# Patient Record
Sex: Male | Born: 1935 | Race: White | Hispanic: No | Smoking: Current every day smoker
Health system: Southern US, Community
[De-identification: ages and names within clinical notes are randomized; demographics above are authoritative.]

## PROBLEM LIST (undated history)

## (undated) DIAGNOSIS — E119 Type 2 diabetes mellitus without complications: Secondary | ICD-10-CM

## (undated) DIAGNOSIS — G629 Polyneuropathy, unspecified: Secondary | ICD-10-CM

## (undated) DIAGNOSIS — N179 Acute kidney failure, unspecified: Secondary | ICD-10-CM

## (undated) DIAGNOSIS — I10 Essential (primary) hypertension: Secondary | ICD-10-CM

## (undated) HISTORY — PX: NO PAST SURGERIES: SHX2092

---

## 2013-03-19 DIAGNOSIS — E119 Type 2 diabetes mellitus without complications: Secondary | ICD-10-CM | POA: Diagnosis not present

## 2013-06-20 DIAGNOSIS — M109 Gout, unspecified: Secondary | ICD-10-CM | POA: Diagnosis not present

## 2013-06-20 DIAGNOSIS — E119 Type 2 diabetes mellitus without complications: Secondary | ICD-10-CM | POA: Diagnosis not present

## 2013-06-20 DIAGNOSIS — L0291 Cutaneous abscess, unspecified: Secondary | ICD-10-CM | POA: Diagnosis not present

## 2013-06-20 DIAGNOSIS — I10 Essential (primary) hypertension: Secondary | ICD-10-CM | POA: Diagnosis not present

## 2013-06-20 DIAGNOSIS — L0390 Cellulitis, unspecified: Secondary | ICD-10-CM | POA: Diagnosis not present

## 2013-06-20 DIAGNOSIS — G619 Inflammatory polyneuropathy, unspecified: Secondary | ICD-10-CM | POA: Diagnosis not present

## 2013-12-19 DIAGNOSIS — E119 Type 2 diabetes mellitus without complications: Secondary | ICD-10-CM | POA: Diagnosis not present

## 2013-12-19 DIAGNOSIS — I10 Essential (primary) hypertension: Secondary | ICD-10-CM | POA: Diagnosis not present

## 2013-12-26 DIAGNOSIS — I10 Essential (primary) hypertension: Secondary | ICD-10-CM | POA: Diagnosis not present

## 2013-12-26 DIAGNOSIS — G6289 Other specified polyneuropathies: Secondary | ICD-10-CM | POA: Diagnosis not present

## 2013-12-26 DIAGNOSIS — E119 Type 2 diabetes mellitus without complications: Secondary | ICD-10-CM | POA: Diagnosis not present

## 2013-12-26 DIAGNOSIS — Z716 Tobacco abuse counseling: Secondary | ICD-10-CM | POA: Diagnosis not present

## 2013-12-26 DIAGNOSIS — M1000 Idiopathic gout, unspecified site: Secondary | ICD-10-CM | POA: Diagnosis not present

## 2013-12-26 DIAGNOSIS — Z1389 Encounter for screening for other disorder: Secondary | ICD-10-CM | POA: Diagnosis not present

## 2013-12-26 DIAGNOSIS — N183 Chronic kidney disease, stage 3 (moderate): Secondary | ICD-10-CM | POA: Diagnosis not present

## 2014-06-20 DIAGNOSIS — E119 Type 2 diabetes mellitus without complications: Secondary | ICD-10-CM | POA: Diagnosis not present

## 2014-06-20 DIAGNOSIS — I10 Essential (primary) hypertension: Secondary | ICD-10-CM | POA: Diagnosis not present

## 2014-06-20 DIAGNOSIS — N183 Chronic kidney disease, stage 3 (moderate): Secondary | ICD-10-CM | POA: Diagnosis not present

## 2014-06-27 DIAGNOSIS — N183 Chronic kidney disease, stage 3 (moderate): Secondary | ICD-10-CM | POA: Diagnosis not present

## 2014-06-27 DIAGNOSIS — Z716 Tobacco abuse counseling: Secondary | ICD-10-CM | POA: Diagnosis not present

## 2014-06-27 DIAGNOSIS — M1000 Idiopathic gout, unspecified site: Secondary | ICD-10-CM | POA: Diagnosis not present

## 2014-06-27 DIAGNOSIS — G2581 Restless legs syndrome: Secondary | ICD-10-CM | POA: Diagnosis not present

## 2014-06-27 DIAGNOSIS — I10 Essential (primary) hypertension: Secondary | ICD-10-CM | POA: Diagnosis not present

## 2014-06-27 DIAGNOSIS — E119 Type 2 diabetes mellitus without complications: Secondary | ICD-10-CM | POA: Diagnosis not present

## 2014-06-27 DIAGNOSIS — M1389 Other specified arthritis, multiple sites: Secondary | ICD-10-CM | POA: Diagnosis not present

## 2014-06-27 DIAGNOSIS — G4700 Insomnia, unspecified: Secondary | ICD-10-CM | POA: Diagnosis not present

## 2014-06-27 DIAGNOSIS — G6289 Other specified polyneuropathies: Secondary | ICD-10-CM | POA: Diagnosis not present

## 2014-11-19 DIAGNOSIS — L97509 Non-pressure chronic ulcer of other part of unspecified foot with unspecified severity: Secondary | ICD-10-CM | POA: Diagnosis not present

## 2014-11-20 DIAGNOSIS — L97522 Non-pressure chronic ulcer of other part of left foot with fat layer exposed: Secondary | ICD-10-CM | POA: Diagnosis not present

## 2014-11-20 DIAGNOSIS — L97512 Non-pressure chronic ulcer of other part of right foot with fat layer exposed: Secondary | ICD-10-CM | POA: Diagnosis not present

## 2014-11-25 DIAGNOSIS — I10 Essential (primary) hypertension: Secondary | ICD-10-CM | POA: Diagnosis not present

## 2014-11-25 DIAGNOSIS — F17200 Nicotine dependence, unspecified, uncomplicated: Secondary | ICD-10-CM | POA: Diagnosis not present

## 2014-11-25 DIAGNOSIS — I739 Peripheral vascular disease, unspecified: Secondary | ICD-10-CM | POA: Diagnosis not present

## 2014-12-04 DIAGNOSIS — Z79899 Other long term (current) drug therapy: Secondary | ICD-10-CM | POA: Diagnosis not present

## 2014-12-04 DIAGNOSIS — L97512 Non-pressure chronic ulcer of other part of right foot with fat layer exposed: Secondary | ICD-10-CM | POA: Diagnosis not present

## 2014-12-04 DIAGNOSIS — L97329 Non-pressure chronic ulcer of left ankle with unspecified severity: Secondary | ICD-10-CM | POA: Diagnosis not present

## 2014-12-04 DIAGNOSIS — E11622 Type 2 diabetes mellitus with other skin ulcer: Secondary | ICD-10-CM | POA: Diagnosis not present

## 2014-12-04 DIAGNOSIS — L97519 Non-pressure chronic ulcer of other part of right foot with unspecified severity: Secondary | ICD-10-CM | POA: Diagnosis not present

## 2014-12-04 DIAGNOSIS — I10 Essential (primary) hypertension: Secondary | ICD-10-CM | POA: Diagnosis not present

## 2014-12-04 DIAGNOSIS — L97322 Non-pressure chronic ulcer of left ankle with fat layer exposed: Secondary | ICD-10-CM | POA: Diagnosis not present

## 2014-12-04 DIAGNOSIS — E11621 Type 2 diabetes mellitus with foot ulcer: Secondary | ICD-10-CM | POA: Diagnosis not present

## 2014-12-04 DIAGNOSIS — F17210 Nicotine dependence, cigarettes, uncomplicated: Secondary | ICD-10-CM | POA: Diagnosis not present

## 2014-12-12 DIAGNOSIS — I87313 Chronic venous hypertension (idiopathic) with ulcer of bilateral lower extremity: Secondary | ICD-10-CM | POA: Diagnosis not present

## 2014-12-12 DIAGNOSIS — L97319 Non-pressure chronic ulcer of right ankle with unspecified severity: Secondary | ICD-10-CM | POA: Diagnosis not present

## 2014-12-12 DIAGNOSIS — L97519 Non-pressure chronic ulcer of other part of right foot with unspecified severity: Secondary | ICD-10-CM | POA: Diagnosis not present

## 2014-12-12 DIAGNOSIS — L97322 Non-pressure chronic ulcer of left ankle with fat layer exposed: Secondary | ICD-10-CM | POA: Diagnosis not present

## 2014-12-12 DIAGNOSIS — L97512 Non-pressure chronic ulcer of other part of right foot with fat layer exposed: Secondary | ICD-10-CM | POA: Diagnosis not present

## 2014-12-12 DIAGNOSIS — L97329 Non-pressure chronic ulcer of left ankle with unspecified severity: Secondary | ICD-10-CM | POA: Diagnosis not present

## 2014-12-12 DIAGNOSIS — I83023 Varicose veins of left lower extremity with ulcer of ankle: Secondary | ICD-10-CM | POA: Diagnosis not present

## 2014-12-12 DIAGNOSIS — I872 Venous insufficiency (chronic) (peripheral): Secondary | ICD-10-CM | POA: Diagnosis not present

## 2014-12-18 DIAGNOSIS — E119 Type 2 diabetes mellitus without complications: Secondary | ICD-10-CM | POA: Diagnosis not present

## 2014-12-18 DIAGNOSIS — M1389 Other specified arthritis, multiple sites: Secondary | ICD-10-CM | POA: Diagnosis not present

## 2014-12-18 DIAGNOSIS — N183 Chronic kidney disease, stage 3 (moderate): Secondary | ICD-10-CM | POA: Diagnosis not present

## 2014-12-18 DIAGNOSIS — I10 Essential (primary) hypertension: Secondary | ICD-10-CM | POA: Diagnosis not present

## 2014-12-25 DIAGNOSIS — G4700 Insomnia, unspecified: Secondary | ICD-10-CM | POA: Diagnosis not present

## 2014-12-25 DIAGNOSIS — M1389 Other specified arthritis, multiple sites: Secondary | ICD-10-CM | POA: Diagnosis not present

## 2014-12-25 DIAGNOSIS — G6289 Other specified polyneuropathies: Secondary | ICD-10-CM | POA: Diagnosis not present

## 2014-12-25 DIAGNOSIS — Z716 Tobacco abuse counseling: Secondary | ICD-10-CM | POA: Diagnosis not present

## 2014-12-25 DIAGNOSIS — G2581 Restless legs syndrome: Secondary | ICD-10-CM | POA: Diagnosis not present

## 2014-12-25 DIAGNOSIS — E119 Type 2 diabetes mellitus without complications: Secondary | ICD-10-CM | POA: Diagnosis not present

## 2014-12-25 DIAGNOSIS — N183 Chronic kidney disease, stage 3 (moderate): Secondary | ICD-10-CM | POA: Diagnosis not present

## 2014-12-25 DIAGNOSIS — I10 Essential (primary) hypertension: Secondary | ICD-10-CM | POA: Diagnosis not present

## 2014-12-25 DIAGNOSIS — M1000 Idiopathic gout, unspecified site: Secondary | ICD-10-CM | POA: Diagnosis not present

## 2015-06-03 ENCOUNTER — Encounter (HOSPITAL_COMMUNITY): Payer: Self-pay | Admitting: Emergency Medicine

## 2015-06-03 ENCOUNTER — Inpatient Hospital Stay (HOSPITAL_COMMUNITY): Payer: Medicare Other

## 2015-06-03 ENCOUNTER — Emergency Department (HOSPITAL_COMMUNITY): Payer: Medicare Other

## 2015-06-03 ENCOUNTER — Inpatient Hospital Stay (HOSPITAL_COMMUNITY)
Admission: EM | Admit: 2015-06-03 | Discharge: 2015-06-05 | DRG: 310 | Disposition: A | Discharge status: Home / Self Care | Payer: Medicare Other | Attending: Internal Medicine | Admitting: Internal Medicine

## 2015-06-03 DIAGNOSIS — R55 Syncope and collapse: Secondary | ICD-10-CM | POA: Diagnosis present

## 2015-06-03 DIAGNOSIS — G4700 Insomnia, unspecified: Secondary | ICD-10-CM | POA: Diagnosis present

## 2015-06-03 DIAGNOSIS — J90 Pleural effusion, not elsewhere classified: Secondary | ICD-10-CM | POA: Diagnosis not present

## 2015-06-03 DIAGNOSIS — Z7982 Long term (current) use of aspirin: Secondary | ICD-10-CM | POA: Diagnosis not present

## 2015-06-03 DIAGNOSIS — D649 Anemia, unspecified: Secondary | ICD-10-CM | POA: Diagnosis present

## 2015-06-03 DIAGNOSIS — E119 Type 2 diabetes mellitus without complications: Secondary | ICD-10-CM

## 2015-06-03 DIAGNOSIS — I517 Cardiomegaly: Secondary | ICD-10-CM | POA: Diagnosis not present

## 2015-06-03 DIAGNOSIS — E1142 Type 2 diabetes mellitus with diabetic polyneuropathy: Secondary | ICD-10-CM | POA: Diagnosis present

## 2015-06-03 DIAGNOSIS — I4892 Unspecified atrial flutter: Secondary | ICD-10-CM

## 2015-06-03 DIAGNOSIS — I4891 Unspecified atrial fibrillation: Principal | ICD-10-CM | POA: Diagnosis present

## 2015-06-03 DIAGNOSIS — F17210 Nicotine dependence, cigarettes, uncomplicated: Secondary | ICD-10-CM | POA: Diagnosis present

## 2015-06-03 DIAGNOSIS — I484 Atypical atrial flutter: Secondary | ICD-10-CM | POA: Diagnosis present

## 2015-06-03 DIAGNOSIS — N289 Disorder of kidney and ureter, unspecified: Secondary | ICD-10-CM

## 2015-06-03 DIAGNOSIS — E1122 Type 2 diabetes mellitus with diabetic chronic kidney disease: Secondary | ICD-10-CM | POA: Diagnosis present

## 2015-06-03 DIAGNOSIS — I129 Hypertensive chronic kidney disease with stage 1 through stage 4 chronic kidney disease, or unspecified chronic kidney disease: Secondary | ICD-10-CM | POA: Diagnosis present

## 2015-06-03 DIAGNOSIS — R42 Dizziness and giddiness: Secondary | ICD-10-CM | POA: Diagnosis not present

## 2015-06-03 DIAGNOSIS — I10 Essential (primary) hypertension: Secondary | ICD-10-CM | POA: Diagnosis present

## 2015-06-03 DIAGNOSIS — R001 Bradycardia, unspecified: Secondary | ICD-10-CM | POA: Diagnosis not present

## 2015-06-03 DIAGNOSIS — N179 Acute kidney failure, unspecified: Secondary | ICD-10-CM

## 2015-06-03 DIAGNOSIS — Z7984 Long term (current) use of oral hypoglycemic drugs: Secondary | ICD-10-CM

## 2015-06-03 DIAGNOSIS — N189 Chronic kidney disease, unspecified: Secondary | ICD-10-CM | POA: Diagnosis present

## 2015-06-03 DIAGNOSIS — I4510 Unspecified right bundle-branch block: Secondary | ICD-10-CM | POA: Diagnosis present

## 2015-06-03 HISTORY — DX: Polyneuropathy, unspecified: G62.9

## 2015-06-03 HISTORY — DX: Type 2 diabetes mellitus without complications: E11.9

## 2015-06-03 LAB — CBC WITH DIFFERENTIAL/PLATELET
Basophils Absolute: 0 10*3/uL (ref 0.0–0.1)
Basophils Relative: 0 %
Eosinophils Absolute: 0.2 10*3/uL (ref 0.0–0.7)
Eosinophils Relative: 3 %
HEMATOCRIT: 31 % — AB (ref 39.0–52.0)
HEMOGLOBIN: 9.7 g/dL — AB (ref 13.0–17.0)
LYMPHS ABS: 1.3 10*3/uL (ref 0.7–4.0)
LYMPHS PCT: 16 %
MCH: 28.4 pg (ref 26.0–34.0)
MCHC: 31.3 g/dL (ref 30.0–36.0)
MCV: 90.6 fL (ref 78.0–100.0)
Monocytes Absolute: 0.5 10*3/uL (ref 0.1–1.0)
Monocytes Relative: 7 %
NEUTROS ABS: 6.1 10*3/uL (ref 1.7–7.7)
NEUTROS PCT: 74 %
Platelets: 221 10*3/uL (ref 150–400)
RBC: 3.42 MIL/uL — AB (ref 4.22–5.81)
RDW: 15.6 % — ABNORMAL HIGH (ref 11.5–15.5)
WBC: 8.2 10*3/uL (ref 4.0–10.5)

## 2015-06-03 LAB — COMPREHENSIVE METABOLIC PANEL
ALK PHOS: 97 U/L (ref 38–126)
ALT: 19 U/L (ref 17–63)
AST: 24 U/L (ref 15–41)
Albumin: 3.1 g/dL — ABNORMAL LOW (ref 3.5–5.0)
Anion gap: 10 (ref 5–15)
BUN: 42 mg/dL — ABNORMAL HIGH (ref 6–20)
CALCIUM: 8.9 mg/dL (ref 8.9–10.3)
CO2: 21 mmol/L — AB (ref 22–32)
CREATININE: 3.2 mg/dL — AB (ref 0.61–1.24)
Chloride: 104 mmol/L (ref 101–111)
GFR, EST AFRICAN AMERICAN: 20 mL/min — AB (ref >60)
GFR, EST NON AFRICAN AMERICAN: 17 mL/min — AB (ref >60)
Glucose, Bld: 175 mg/dL — ABNORMAL HIGH (ref 65–99)
Potassium: 4.9 mmol/L (ref 3.5–5.1)
SODIUM: 135 mmol/L (ref 135–145)
Total Bilirubin: 0.7 mg/dL (ref 0.3–1.2)
Total Protein: 7.3 g/dL (ref 6.5–8.1)

## 2015-06-03 LAB — FERRITIN: Ferritin: 40 ng/mL (ref 24–336)

## 2015-06-03 LAB — BRAIN NATRIURETIC PEPTIDE: B Natriuretic Peptide: 141 pg/mL — ABNORMAL HIGH (ref 0.0–100.0)

## 2015-06-03 LAB — IRON AND TIBC
IRON: 17 ug/dL — AB (ref 45–182)
SATURATION RATIOS: 6 % — AB (ref 17.9–39.5)
TIBC: 287 ug/dL (ref 250–450)
UIBC: 270 ug/dL

## 2015-06-03 LAB — MAGNESIUM: MAGNESIUM: 2.1 mg/dL (ref 1.7–2.4)

## 2015-06-03 LAB — TSH: TSH: 3.282 u[IU]/mL (ref 0.350–4.500)

## 2015-06-03 LAB — VITAMIN B12: Vitamin B-12: 1163 pg/mL — ABNORMAL HIGH (ref 180–914)

## 2015-06-03 LAB — D-DIMER, QUANTITATIVE (NOT AT ARMC): D DIMER QUANT: 2.49 ug{FEU}/mL — AB (ref 0.00–0.50)

## 2015-06-03 LAB — PHOSPHORUS: Phosphorus: 4 mg/dL (ref 2.5–4.6)

## 2015-06-03 LAB — TROPONIN I: Troponin I: <0.03 ng/mL (ref <0.031)

## 2015-06-03 MED ORDER — ONDANSETRON HCL 4 MG PO TABS: 4.0000 mg | ORAL_TABLET | Freq: Four times a day (QID) | ORAL | Status: DC | PRN

## 2015-06-03 MED ORDER — ACETAMINOPHEN 650 MG RE SUPP: 650.0000 mg | Freq: Four times a day (QID) | RECTAL | Status: DC | PRN

## 2015-06-03 MED ORDER — HYDROCODONE-ACETAMINOPHEN 5-325 MG PO TABS: 1.0000 | ORAL_TABLET | ORAL | Status: DC | PRN

## 2015-06-03 MED ORDER — ACETAMINOPHEN 325 MG PO TABS: 650.0000 mg | ORAL_TABLET | Freq: Four times a day (QID) | ORAL | Status: DC | PRN

## 2015-06-03 MED ORDER — ADULT MULTIVITAMIN W/MINERALS CH
1.0000 | ORAL_TABLET | Freq: Every day | ORAL | Status: DC
Start: 2015-06-04 — End: 2015-06-05
  Administered 2015-06-04 – 2015-06-05 (×2): 1 via ORAL
  Filled 2015-06-03 (×2): qty 1

## 2015-06-03 MED ORDER — BISACODYL 5 MG PO TBEC: 5.0000 mg | DELAYED_RELEASE_TABLET | Freq: Every day | ORAL | Status: DC | PRN

## 2015-06-03 MED ORDER — SODIUM CHLORIDE 0.9 % IV SOLN: INTRAVENOUS | Status: DC

## 2015-06-03 MED ORDER — PRAMIPEXOLE DIHYDROCHLORIDE 1 MG PO TABS
1.0000 mg | ORAL_TABLET | Freq: Every day | ORAL | Status: DC
  Administered 2015-06-03 – 2015-06-04 (×2): 1 mg via ORAL
  Filled 2015-06-03 (×3): qty 1

## 2015-06-03 MED ORDER — ONDANSETRON HCL 4 MG/2ML IJ SOLN: 4.0000 mg | Freq: Four times a day (QID) | INTRAMUSCULAR | Status: DC | PRN

## 2015-06-03 MED ORDER — TECHNETIUM TC 99M DIETHYLENETRIAME-PENTAACETIC ACID
30.0000 | Freq: Once | INTRAVENOUS | Status: AC | PRN
  Administered 2015-06-03: 30 via RESPIRATORY_TRACT

## 2015-06-03 MED ORDER — OMEGA-3-ACID ETHYL ESTERS 1 G PO CAPS
1.0000 g | ORAL_CAPSULE | Freq: Every day | ORAL | Status: DC
  Administered 2015-06-04 – 2015-06-05 (×2): 1 g via ORAL
  Filled 2015-06-03 (×3): qty 1

## 2015-06-03 MED ORDER — SODIUM CHLORIDE 0.9% FLUSH
3.0000 mL | Freq: Two times a day (BID) | INTRAVENOUS | Status: DC
  Administered 2015-06-03 – 2015-06-04 (×3): 3 mL via INTRAVENOUS

## 2015-06-03 MED ORDER — TECHNETIUM TO 99M ALBUMIN AGGREGATED
4.0000 | Freq: Once | INTRAVENOUS | Status: AC | PRN
  Administered 2015-06-03: 4 via INTRAVENOUS

## 2015-06-03 MED ORDER — TRAZODONE HCL 50 MG PO TABS
150.0000 mg | ORAL_TABLET | Freq: Every day | ORAL | Status: DC
  Administered 2015-06-03 – 2015-06-04 (×2): 150 mg via ORAL
  Filled 2015-06-03 (×2): qty 3

## 2015-06-03 MED ORDER — POLYETHYLENE GLYCOL 3350 17 G PO PACK: 17.0000 g | PACK | Freq: Every day | ORAL | Status: DC | PRN

## 2015-06-03 MED ORDER — INSULIN ASPART 100 UNIT/ML ~~LOC~~ SOLN
0.0000 [IU] | Freq: Three times a day (TID) | SUBCUTANEOUS | Status: DC
  Administered 2015-06-04: 2 [IU] via SUBCUTANEOUS
  Administered 2015-06-04 – 2015-06-05 (×2): 3 [IU] via SUBCUTANEOUS

## 2015-06-03 MED ORDER — HEPARIN SODIUM (PORCINE) 5000 UNIT/ML IJ SOLN
5000.0000 [IU] | Freq: Three times a day (TID) | INTRAMUSCULAR | Status: DC
  Filled 2015-06-03: qty 1

## 2015-06-03 MED ORDER — ASPIRIN EC 81 MG PO TBEC
81.0000 mg | DELAYED_RELEASE_TABLET | Freq: Every day | ORAL | Status: DC
  Administered 2015-06-04: 81 mg via ORAL
  Filled 2015-06-03: qty 1

## 2015-06-03 MED ORDER — GABAPENTIN 300 MG PO CAPS
300.0000 mg | ORAL_CAPSULE | Freq: Two times a day (BID) | ORAL | Status: DC
Start: 2015-06-03 — End: 2015-06-05
  Administered 2015-06-03 – 2015-06-05 (×4): 300 mg via ORAL
  Filled 2015-06-03 (×4): qty 1

## 2015-06-03 MED ORDER — VITAMIN D 1000 UNITS PO TABS
1000.0000 [IU] | ORAL_TABLET | Freq: Every day | ORAL | Status: DC
  Administered 2015-06-04 – 2015-06-05 (×2): 1000 [IU] via ORAL
  Filled 2015-06-03 (×3): qty 1

## 2015-06-03 NOTE — ED Provider Notes (Signed)
CSN: [id number]     Arrival date & time 06/03/15  1352 History   First MD Initiated Contact with Patient 06/03/15 1428     Chief Complaint  Patient presents with  . Loss of Consciousness     (Consider location/radiation/quality/duration/timing/severity/associated sxs/prior Treatment) HPI 81 year old male who presents with loss of consciousness. He has a history of type 2 diabetes. Patient lives at home by himself and states that 4 nights ago he was watching the race on TV when he thinks he may have passed out. I he states that the next thing he noticed, he had awoken 3 hours later and has not known what had happened. He had called his daughter, who subsequently called his primary care doctor and recommended that he come to the ED for evaluation. He denies any bowel incontinence, urinary incontinence, or tongue biting. States it does not happen to him before. Denies any preceding headache, vision or speech changes, focal numbness or weakness, chest pain, difficulty breathing, palpitations, or diaphoresis. States that he otherwise has been in his usual state of health. Has had mild nonproductive cough over the past month without having fevers, chills, worsening lower extremity edema, orthopnea or PND. Past Medical History  Diagnosis Date  . Neuropathy ([place])   . Diabetes mellitus without complication ([place])    History reviewed. No pertinent past surgical history. No family history on file. Social History  Substance Use Topics  . Smoking status: Current Every Day Smoker -- 1.00 packs/day    Types: Cigarettes  . Smokeless tobacco: None  . Alcohol Use: Yes     Comment: occ    Review of Systems 10/14 systems reviewed and are negative other than those stated in the HPI    Allergies  Review of patient's allergies indicates no known allergies.  Home Medications   Prior to Admission medications   Medication Sig Start Date End Date Taking? Authorizing Provider  aspirin 81 MG tablet Take 81  mg by mouth daily.   Yes Historical Provider, MD  cholecalciferol (VITAMIN D) 1000 units tablet Take 1,000 Units by mouth daily.   Yes Historical Provider, MD  gabapentin (NEURONTIN) 300 MG capsule Take 300 mg by mouth 2 (two) times daily.   Yes Historical Provider, MD  glyBURIDE (DIABETA) 5 MG tablet Take 5 mg by mouth 2 (two) times daily with a meal.   Yes Historical Provider, MD  lisinopril (PRINIVIL,ZESTRIL) 40 MG tablet Take 40 mg by mouth daily.   Yes Historical Provider, MD  Multiple Vitamins-Minerals (MULTIVITAMIN WITH MINERALS) tablet Take 1 tablet by mouth daily.   Yes Historical Provider, MD  Omega-3 Fatty Acids (FISH OIL) 1000 MG CAPS Take 1 capsule by mouth 2 (two) times daily.   Yes Historical Provider, MD  pramipexole (MIRAPEX) 1 MG tablet Take 1 mg by mouth at bedtime.   Yes Historical Provider, MD  traZODone (DESYREL) 150 MG tablet Take by mouth at bedtime.   Yes Historical Provider, MD   BP 97/68 mmHg  Pulse 90  Temp(Src) 98.7 F (37.1 C) (Oral)  Resp 20  Ht 6\' 2"  (1.88 m)  Wt 203 lb (92.08 kg)  BMI 26.05 kg/m2  SpO2 94% Physical Exam Physical Exam  Nursing note and vitals reviewed. Constitutional: Well developed, well nourished, non-toxic, and in no acute distress Head: Normocephalic and atraumatic.  Mouth/Throat: Oropharynx is clear and moist.  Neck: Normal range of motion. Neck supple.  Cardiovascular: Normal rate and regular rhythm.  +1 bilateral edema. Pulmonary/Chest: Effort normal and breath sounds  diminished at bilateral bases. No conversational dypsnea Abdominal: Soft. There is no tenderness. There is no rebound and no guarding.  Musculoskeletal: Normal range of motion.  Neurological: Alert, no facial droop, fluent speech, moves all extremities symmetrically Skin: Skin is warm and dry.  Psychiatric: Cooperative  ED Course  Procedures (including critical care time) Labs Review Labs Reviewed  CBC WITH DIFFERENTIAL/PLATELET - Abnormal; Notable for the  following:    RBC 3.42 (*)    Hemoglobin 9.7 (*)    HCT 31.0 (*)    RDW 15.6 (*)    All other components within normal limits  COMPREHENSIVE METABOLIC PANEL - Abnormal; Notable for the following:    CO2 21 (*)    Glucose, Bld 175 (*)    BUN 42 (*)    Creatinine, Ser 3.20 (*)    Albumin 3.1 (*)    GFR calc non Af Amer 17 (*)    GFR calc Af Amer 20 (*)    All other components within normal limits  BRAIN NATRIURETIC PEPTIDE - Abnormal; Notable for the following:    B Natriuretic Peptide 141.0 (*)    All other components within normal limits  D-DIMER, QUANTITATIVE (NOT AT [facility]) - Abnormal; Notable for the following:    D-Dimer, Quant 2.49 (*)    All other components within normal limits  TROPONIN I  MAGNESIUM  PHOSPHORUS  TSH    Imaging Review Dg Chest 2 View  06/03/2015  CLINICAL DATA:  81 year old current smoker with hypertension and diabetes, presenting with bradycardia. EXAM: CHEST  2 VIEW COMPARISON:  06/27/2014. FINDINGS: Cardiac silhouette mildly to moderately enlarged with significant interval increase in heart size since the prior examination. Thoracic aorta tortuous and atherosclerotic, unchanged. Hilar and mediastinal contours otherwise unremarkable. Emphysematous changes in the upper lobes. Mild pulmonary venous hypertension without overt edema. Small bilateral pleural effusions. Linear atelectasis in the lower lobes and right middle lobe. No confluent airspace consolidation. Degenerative changes involving the thoracic spine. IMPRESSION: 1. Small bilateral pleural effusions. 2. Linear atelectasis in the lower lobes right middle lobe. 3. Mild to moderate cardiomegaly with significant interval increase in heart size since June, 2016. Pulmonary venous hypertension without overt edema. Electronically Signed   By: Evangeline Dakin M.D.   On: 06/03/2015 14:32   Ct Head Wo Contrast  06/03/2015  CLINICAL DATA:  Dizziness and syncope EXAM: CT HEAD WITHOUT CONTRAST TECHNIQUE: Contiguous  axial images were obtained from the base of the skull through the vertex without intravenous contrast. COMPARISON:  None. FINDINGS: There is mild diffuse atrophy. There is no intracranial mass hemorrhage, extra-axial fluid collection, or midline shift. There is slight small vessel disease in the centra semiovale bilaterally. No acute infarct is evident. A small amount of benign calcification is noted in medial right temporal lobe region. The bony calvarium appears intact although somewhat osteoporotic. The mastoid air cells clear. No intraorbital lesions are evident. There are foci of calcification in the cavernous carotid artery regions as well as in both distal vertebral arteries. IMPRESSION: Mild atrophy with mild periventricular small vessel disease. No intracranial mass, hemorrhage, or evidence of acute infarct. Electronically Signed   By: Lowella Grip III M.D.   On: 06/03/2015 16:05   I have personally reviewed and evaluated these images and lab results as part of my medical decision-making.   EKG Interpretation   Date/Time:  Tuesday Jun 03 2015 14:10:08 EDT Ventricular Rate:  96 PR Interval:    QRS Duration: 160 QT Interval:  384 QTC Calculation:  485 R Axis:   117 Text Interpretation:  Atrial flutter Ventricular bigeminy RBBB and LPFB No  prior EKG for comparison Confirmed by Perez Dirico MD, Namari Breton [phone]) on 06/03/2015  2:30:31 PM     This patients CHA2DS2-VASc Score and unadjusted Ischemic Stroke Rate (% per year) is equal to 4.8 % stroke rate/year from a score of 4  Above score calculated as 1 point each if present [CHF, HTN, DM, Vascular=MI/PAD/Aortic Plaque, Age if 65-74, or Male] Above score calculated as 2 points each if present [Age > 75, or Stroke/TIA/TE]   MDM   Final diagnoses:  Syncope  Atypical atrial flutter ([place])  Kidney disease  Cardiomegaly    81 year old male with history of diabetes who presents with questionable syncopal episode. Is asymptomatic on arrival,  but is noted to have new onset atrial flutter with ventricular bigeminy. With cardiomegaly on chest x-ray, bilateral pleural effusions, and minimally elevated BNP of 540. Concern for new onset atrial flutter and potential new onset heart failure, that certainly can contribute to potential syncope. Troponin negative. Anemia of 9.7, but no history of GI bleeding, likely this is chronic. With decreased kidney function as well, 3.3, unclear baseline but likely chornic.   Unclear if syncopal episode was truly 3 hours in length, which would make cardiogenic syncope less likely.  CT head negative for mass or bleeding for potential ? Seizure w/u. Was noted to have some low SpO2 low 90s during ED course and ddimer positive. Will undergo V/Q for evaluation of potential PE. Discussed with Dr. Myna Hidalgo, who will admit for syncopal w/u.     Forde Dandy, MD 06/03/15 [id number]

## 2015-06-03 NOTE — H&P (Signed)
History and Physical    Dylan Williamson [id number] DOB: [birth year] DOA: 06/03/2015  PCP: Rory Percy, MD   Patient coming from: Home   Chief Complaint: Syncopal episode  HPI: Dylan Williamson is a 81 y.o. male with medical history significant for hypertension, type 2 diabetes mellitus with peripheral neuropathy, and insomnia who presents to the ED at the direction of his PCP for evaluation of a syncopal episode that occurred the night of 06/01/2015. Patient reports pain in his usual state of health when he was sitting on his couch preparing to watch a car race on TV when he believes he "blacked out," waking ~3 hours later. Patient denies any preceding symptoms such as chest pain, palpitations, lightheadedness, nausea, or sweats. He reports being in his usual state of health since time of that episode. He denies any recent fevers, chills, long distance travel, or sick contacts. He has had no dyspnea or cough and denies headaches, focal numbness or weakness, or loss of coordination. He has never experienced a similar episode previously. He was hesitant to come to the emergency department, but eventually agreed at the urging of his daughter and PCP.  ED Course: Upon arrival to the ED, patient is found to be afebrile, saturating well on room air, and with vital signs stable. EKG features atrial flutter with right bundle branch block and left anterior fascicular block. Chest x-ray demonstrates small bilateral pleural effusions and mild to moderate cardiomegaly with significant increase since the prior study one year ago. Head CT is negative for acute intracranial abnormality. CMP returns notable for BUN of 42 and serum creatinine of 3.20 with unknown baseline. CBC features a hemoglobin of 9.7 with normal MCV. Troponin is undetectable and BNP is mildly elevated 241. Patient was monitored on telemetry in the emergency department, remained in atrial arrhythmia, TSH was normal, and a d-dimer was obtained  by the EDP. D-dimer turns elevated at 2.49 and VQ scan has been ordered. Patient has remained hemodynamically stable in the emergency department and will be admitted to the hospital for ongoing evaluation and management of syncopal episode with apparent new onset atrial flutter and kidney disease of unknown chronicity.  Review of Systems:  All other systems reviewed and apart from HPI, are negative.  Past Medical History  Diagnosis Date  . Neuropathy ([place])   . Diabetes mellitus without complication ([place])     History reviewed. No pertinent past surgical history.   reports that he has been smoking Cigarettes.  He has been smoking about 1.00 pack per day. He does not have any smokeless tobacco history on file. He reports that he drinks alcohol. He reports that he does not use illicit drugs.  No Known Allergies  History reviewed. No pertinent family history.   Prior to Admission medications   Medication Sig Start Date End Date Taking? Authorizing Provider  aspirin 81 MG tablet Take 81 mg by mouth daily.   Yes Historical Provider, MD  cholecalciferol (VITAMIN D) 1000 units tablet Take 1,000 Units by mouth daily.   Yes Historical Provider, MD  gabapentin (NEURONTIN) 300 MG capsule Take 300 mg by mouth 2 (two) times daily.   Yes Historical Provider, MD  glyBURIDE (DIABETA) 5 MG tablet Take 5 mg by mouth 2 (two) times daily with a meal.   Yes Historical Provider, MD  lisinopril (PRINIVIL,ZESTRIL) 40 MG tablet Take 40 mg by mouth daily.   Yes Historical Provider, MD  Multiple Vitamins-Minerals (MULTIVITAMIN WITH MINERALS) tablet Take 1 tablet by mouth daily.  Yes Historical Provider, MD  Omega-3 Fatty Acids (FISH OIL) 1000 MG CAPS Take 1 capsule by mouth 2 (two) times daily.   Yes Historical Provider, MD  pramipexole (MIRAPEX) 1 MG tablet Take 1 mg by mouth at bedtime.   Yes Historical Provider, MD  traZODone (DESYREL) 150 MG tablet Take by mouth at bedtime.   Yes Historical Provider, MD     Physical Exam: Filed Vitals:   06/03/15 1645 06/03/15 1650 06/03/15 1700 06/03/15 1715  BP:  97/68 109/77   Pulse: 71 90 83 72  Temp:      TempSrc:      Resp: 18 20 21 22   Height:      Weight:      SpO2: 94% 94% 95% 94%      Constitutional: NAD, calm, comfortable Eyes: PERTLA, lids and conjunctivae normal ENMT: Mucous membranes are moist. Posterior pharynx clear of any exudate or lesions.   Neck: normal, supple, no masses, no thyromegaly Respiratory: clear to auscultation bilaterally, no wheezing, no crackles. Normal respiratory effort.    Cardiovascular: S1 & S2 heard, irregular rhythm, 1+ pretibial pitting edema. No significant JVD. Abdomen: No distension, no tenderness, no masses palpated. Bowel sounds normal.  Musculoskeletal: no clubbing / cyanosis. No joint deformity upper and lower extremities. Normal muscle tone.  Skin: Hyperpigmentation to distal LEs b/l with scarring. Warm, dry, well-perfused. Neurologic: CN 2-12 grossly intact. Sensation intact, DTR normal. Strength 5/5 in all 4 limbs.  Psychiatric: Normal judgment and insight. Alert and oriented x 3. Normal mood and affect.     Labs on Admission: I have personally reviewed following labs and imaging studies  CBC:  Recent Labs Lab 06/03/15 1404  WBC 8.2  NEUTROABS 6.1  HGB 9.7*  HCT 31.0*  MCV 90.6  PLT [id number]   Basic Metabolic Panel:  Recent Labs Lab 06/03/15 1404  NA 135  K 4.9  CL 104  CO2 21*  GLUCOSE 175*  BUN 42*  CREATININE 3.20*  CALCIUM 8.9  MG 2.1  PHOS 4.0   GFR: Estimated Creatinine Clearance: 22.1 mL/min (by C-G formula based on Cr of 3.2). Liver Function Tests:  Recent Labs Lab 06/03/15 1404  AST 24  ALT 19  ALKPHOS 97  BILITOT 0.7  PROT 7.3  ALBUMIN 3.1*   No results for input(s): LIPASE, AMYLASE in the last 168 hours. No results for input(s): AMMONIA in the last 168 hours. Coagulation Profile: No results for input(s): INR, PROTIME in the last 168 hours. Cardiac  Enzymes:  Recent Labs Lab 06/03/15 1404  TROPONINI <0.03   BNP (last 3 results) No results for input(s): PROBNP in the last 8760 hours. HbA1C: No results for input(s): HGBA1C in the last 72 hours. CBG: No results for input(s): GLUCAP in the last 168 hours. Lipid Profile: No results for input(s): CHOL, HDL, LDLCALC, TRIG, CHOLHDL, LDLDIRECT in the last 72 hours. Thyroid Function Tests:  Recent Labs  06/03/15 1404  TSH 3.282   Anemia Panel: No results for input(s): VITAMINB12, FOLATE, FERRITIN, TIBC, IRON, RETICCTPCT in the last 72 hours. Urine analysis: No results found for: COLORURINE, APPEARANCEUR, LABSPEC, PHURINE, GLUCOSEU, HGBUR, BILIRUBINUR, KETONESUR, PROTEINUR, UROBILINOGEN, NITRITE, LEUKOCYTESUR Sepsis Labs: @LABRCNTIP (procalcitonin:4,lacticidven:4) )No results found for this or any previous visit (from the past 240 hour(s)).   Radiological Exams on Admission: Dg Chest 2 View  06/03/2015  CLINICAL DATA:  81 year old current smoker with hypertension and diabetes, presenting with bradycardia. EXAM: CHEST  2 VIEW COMPARISON:  06/27/2014. FINDINGS: Cardiac silhouette mildly to moderately  enlarged with significant interval increase in heart size since the prior examination. Thoracic aorta tortuous and atherosclerotic, unchanged. Hilar and mediastinal contours otherwise unremarkable. Emphysematous changes in the upper lobes. Mild pulmonary venous hypertension without overt edema. Small bilateral pleural effusions. Linear atelectasis in the lower lobes and right middle lobe. No confluent airspace consolidation. Degenerative changes involving the thoracic spine. IMPRESSION: 1. Small bilateral pleural effusions. 2. Linear atelectasis in the lower lobes right middle lobe. 3. Mild to moderate cardiomegaly with significant interval increase in heart size since June, 2016. Pulmonary venous hypertension without overt edema. Electronically Signed   By: Evangeline Dakin M.D.   On: 06/03/2015  14:32   Ct Head Wo Contrast  06/03/2015  CLINICAL DATA:  Dizziness and syncope EXAM: CT HEAD WITHOUT CONTRAST TECHNIQUE: Contiguous axial images were obtained from the base of the skull through the vertex without intravenous contrast. COMPARISON:  None. FINDINGS: There is mild diffuse atrophy. There is no intracranial mass hemorrhage, extra-axial fluid collection, or midline shift. There is slight small vessel disease in the centra semiovale bilaterally. No acute infarct is evident. A small amount of benign calcification is noted in medial right temporal lobe region. The bony calvarium appears intact although somewhat osteoporotic. The mastoid air cells clear. No intraorbital lesions are evident. There are foci of calcification in the cavernous carotid artery regions as well as in both distal vertebral arteries. IMPRESSION: Mild atrophy with mild periventricular small vessel disease. No intracranial mass, hemorrhage, or evidence of acute infarct. Electronically Signed   By: Lowella Grip III M.D.   On: 06/03/2015 16:05    EKG: Independently reviewed. Atrial flutter, RBBB, LaFB  Assessment/Plan  1. Syncope  - Occurred while seated; no prodrome; pt reported feeling well prior to the event and after - Etiology is uncertain, but primary concern is for a cardiac etiology given the new arrhythmia  - Monitor on telemetry  - Obtain echocardiogram, ordered - D-dimer elevated in ED, follow-up VQ scan and treat as indicated    2. New-onset atrial flutter  - Presents with atrial flutter, asymptomatic and normal HR  - CHADS-VASc 5 (age x2, HTN, DM, PAD)  - TSH is wnl, troponin <0.03  - Monitor on telemetry  - Check TTE, ordered  - Continue daily ASA and VTE ppx; consider AC   3. Kidney disease of unknown chronicity  - SCr 3.20 on admission with unknown baseline  - Renal US with cortical thinning suggestive of chronic medical renal disease  - Hold lisinopril for now; avoid nephrotoxins   - Gentle  IVF hydration overnight  - Repeat chem panel in am    4. Hypertension  - At goal currently  - Managed with lisinopril at home  - Hold lisinopril for now given severe kidney disease with uncertain baseline   5. Type II DM  - No A1c on file  - Managed with glyburide at home, holding while in hospital  - Check CBG with meals and qHS - Low-intensity SSI, adjust prn  - Check A1c, pending  6. Anemia - Hgb 9.7 on admission with normal MCV  - No prior available for comparison  - No sign of active blood-loss  - Possibly secondary to CKD  - Check iron studies, B12, and folate; supplement prn  - Check FOBT   7. Insomnia  - Managed with trazodone at home, will continue    DVT prophylaxis: sq heparin  Code Status: Full  Family Communication: Daughter updated at bedside  Disposition Plan: Admit to  telemetry  Consults called: None  Admission status: Inpatient    Vianne Bulls, MD [facility] Pager [phone]  If 7PM-7AM, please contact night-coverage [email or web address] Password [email or web address]  06/03/2015, 5:17 PM

## 2015-06-03 NOTE — ED Notes (Signed)
Saturday night he blacked out for 3 hours.  Dr. Nadara Mustard wanted him to come to the ER to have labs, and EKG, and rule out cardiac problems.  He had fixed him something to eat and had spilled it all over him.  He felt light headed and dizzy.  Denies any problems at this time.

## 2015-06-03 NOTE — ED Notes (Signed)
Report given to Horris Latino, RN for room 333.

## 2015-06-04 ENCOUNTER — Inpatient Hospital Stay (HOSPITAL_COMMUNITY): Payer: Medicare Other

## 2015-06-04 DIAGNOSIS — I4891 Unspecified atrial fibrillation: Principal | ICD-10-CM

## 2015-06-04 DIAGNOSIS — R55 Syncope and collapse: Secondary | ICD-10-CM

## 2015-06-04 LAB — BASIC METABOLIC PANEL
Anion gap: 12 (ref 5–15)
BUN: 43 mg/dL — AB (ref 6–20)
CO2: 21 mmol/L — ABNORMAL LOW (ref 22–32)
CREATININE: 2.99 mg/dL — AB (ref 0.61–1.24)
Calcium: 8.6 mg/dL — ABNORMAL LOW (ref 8.9–10.3)
Chloride: 103 mmol/L (ref 101–111)
GFR calc Af Amer: 22 mL/min — ABNORMAL LOW (ref >60)
GFR, EST NON AFRICAN AMERICAN: 19 mL/min — AB (ref >60)
Glucose, Bld: 137 mg/dL — ABNORMAL HIGH (ref 65–99)
Potassium: 4.8 mmol/L (ref 3.5–5.1)
SODIUM: 136 mmol/L (ref 135–145)

## 2015-06-04 LAB — HEMOGLOBIN A1C
Hgb A1c MFr Bld: 7 % — ABNORMAL HIGH (ref 4.8–5.6)
MEAN PLASMA GLUCOSE: 154 mg/dL

## 2015-06-04 LAB — FOLATE RBC
Folate, Hemolysate: >620 ng/mL
Folate, RBC: >2123 ng/mL (ref >498)
Hematocrit: 29.2 % — ABNORMAL LOW (ref 37.5–51.0)

## 2015-06-04 LAB — NA AND K (SODIUM & POTASSIUM), RAND UR
Potassium Urine: 37 mmol/L
SODIUM UR: 87 mmol/L

## 2015-06-04 LAB — GLUCOSE, CAPILLARY
GLUCOSE-CAPILLARY: 103 mg/dL — AB (ref 65–99)
Glucose-Capillary: 130 mg/dL — ABNORMAL HIGH (ref 65–99)
Glucose-Capillary: 165 mg/dL — ABNORMAL HIGH (ref 65–99)
Glucose-Capillary: 81 mg/dL (ref 65–99)

## 2015-06-04 LAB — CREATININE, URINE, RANDOM: Creatinine, Urine: 103.64 mg/dL

## 2015-06-04 MED ORDER — ENOXAPARIN SODIUM 30 MG/0.3ML ~~LOC~~ SOLN: 30.0000 mg | SUBCUTANEOUS | Status: DC

## 2015-06-04 MED ORDER — APIXABAN 5 MG PO TABS: 5.0000 mg | ORAL_TABLET | Freq: Two times a day (BID) | ORAL | Status: DC

## 2015-06-04 MED ORDER — NICOTINE 14 MG/24HR TD PT24
14.0000 mg | MEDICATED_PATCH | Freq: Every day | TRANSDERMAL | Status: DC
  Administered 2015-06-04 – 2015-06-05 (×2): 14 mg via TRANSDERMAL
  Filled 2015-06-04 (×2): qty 1

## 2015-06-04 MED ORDER — ASPIRIN EC 81 MG PO TBEC: 81.0000 mg | DELAYED_RELEASE_TABLET | Freq: Every day | ORAL | Status: DC

## 2015-06-04 NOTE — Progress Notes (Signed)
PROGRESS NOTE    Dylan Williamson  [id number] DOB: [birth year] DOA: 06/03/2015 PCP: Rory Percy, MD     Brief Narrative:  81 year old man admitted on 5/30 following a syncopal event that occurred 2 days prior to admission. Upon arrival he was found to be in atrial fibrillation and this is thought to be potentially the reason for his syncope. Echocardiogram is pending. Given his CHADSVasc score would benefit from starting anticoagulation.   Assessment & Plan:   Principal Problem:   Syncope Active Problems:   Atrial flutter (HCC)   Kidney disease   Normocytic anemia   Hypertension   Non-insulin dependent type 2 diabetes mellitus ([place])   Insomnia   New onset atrial fibrillation -Discussed EKG and telemetry with Dr. Harl Bowie. He believes this likely represents atrial fibrillation. -Cardiology will see patient in consultation. Unclear if this is the etiology for his syncope but so far workup has been otherwise negative. -Would benefit from anticoagulation to decrease stroke risk, cardiology to make final decision.  Diabetes mellitus -Fair control  Hypertension -Well-controlled  Chronic kidney disease -Unknown chronicity, however creatinine 3.2 on admission down to 2.9 today, suspect some degree of chronic kidney dysfunction given renal ultrasound with cortical thinning suggestive of chronic medical renal disease. Hold lisinopril and avoid nephrotoxins, continue gentle IV fluid hydration.    DVT prophylaxis: Lovenox Code Status: full code Family Communication: daughter at bedside updated on plan of care and all questions answered Disposition Plan: Home in 24-48 hours  Consultants:   Cardiology  Procedures:   None  Antimicrobials:   None    Subjective: Feels well. Anxious to be discharged home.  Objective: Filed Vitals:   06/04/15 0554 06/04/15 0709 06/04/15 0940 06/04/15 0945  BP: 94/57  114/65   Pulse: 72  47 87  Temp: 98.4 F (36.9 C)     TempSrc:  Oral     Resp: 18     Height:      Weight:  94.394 kg (208 lb 1.6 oz)    SpO2: 92%  96% 93%    Intake/Output Summary (Last 24 hours) at 06/04/15 1211 Last data filed at 06/04/15 0900  Gross per 24 hour  Intake    240 ml  Output      0 ml  Net    240 ml   Filed Weights   06/03/15 1400 06/04/15 0709  Weight: 92.08 kg (203 lb) 94.394 kg (208 lb 1.6 oz)    Examination:  General exam: Alert, awake, oriented x 3 Respiratory system: Clear to auscultation. Respiratory effort normal. Cardiovascular system:irregular, no M/R/G Gastrointestinal system: Abdomen is nondistended, soft and nontender. No organomegaly or masses felt. Normal bowel sounds heard. Central nervous system: Alert and oriented. No focal neurological deficits. Extremities: No C/C/E, +pedal pulses Skin: No rashes, lesions or ulcers Psychiatry: Judgement and insight appear normal. Mood & affect appropriate.     Data Reviewed: I have personally reviewed following labs and imaging studies  CBC:  Recent Labs Lab 06/03/15 1404  WBC 8.2  NEUTROABS 6.1  HGB 9.7*  HCT 31.0*  MCV 90.6  PLT [id number]   Basic Metabolic Panel:  Recent Labs Lab 06/03/15 1404 06/04/15 0601  NA 135 136  K 4.9 4.8  CL 104 103  CO2 21* 21*  GLUCOSE 175* 137*  BUN 42* 43*  CREATININE 3.20* 2.99*  CALCIUM 8.9 8.6*  MG 2.1  --   PHOS 4.0  --    GFR: Estimated Creatinine Clearance: 23.7 mL/min (by C-G formula based  on Cr of 2.99). Liver Function Tests:  Recent Labs Lab 06/03/15 1404  AST 24  ALT 19  ALKPHOS 97  BILITOT 0.7  PROT 7.3  ALBUMIN 3.1*   No results for input(s): LIPASE, AMYLASE in the last 168 hours. No results for input(s): AMMONIA in the last 168 hours. Coagulation Profile: No results for input(s): INR, PROTIME in the last 168 hours. Cardiac Enzymes:  Recent Labs Lab 06/03/15 1404  TROPONINI <0.03   BNP (last 3 results) No results for input(s): PROBNP in the last 8760 hours. HbA1C:  Recent Labs   06/03/15 1721  HGBA1C 7.0*   CBG:  Recent Labs Lab 06/04/15 0740 06/04/15 1117  GLUCAP 130* 165*   Lipid Profile: No results for input(s): CHOL, HDL, LDLCALC, TRIG, CHOLHDL, LDLDIRECT in the last 72 hours. Thyroid Function Tests:  Recent Labs  06/03/15 1404  TSH 3.282   Anemia Panel:  Recent Labs  06/03/15 1725  VITAMINB12 1163*  FERRITIN 40  TIBC 287  IRON 17*   Urine analysis: No results found for: COLORURINE, APPEARANCEUR, LABSPEC, PHURINE, GLUCOSEU, HGBUR, BILIRUBINUR, KETONESUR, PROTEINUR, UROBILINOGEN, NITRITE, LEUKOCYTESUR Sepsis Labs: @LABRCNTIP (procalcitonin:4,lacticidven:4)  )No results found for this or any previous visit (from the past 240 hour(s)).       Radiology Studies: Dg Chest 2 View  06/03/2015  CLINICAL DATA:  81 year old current smoker with hypertension and diabetes, presenting with bradycardia. EXAM: CHEST  2 VIEW COMPARISON:  06/27/2014. FINDINGS: Cardiac silhouette mildly to moderately enlarged with significant interval increase in heart size since the prior examination. Thoracic aorta tortuous and atherosclerotic, unchanged. Hilar and mediastinal contours otherwise unremarkable. Emphysematous changes in the upper lobes. Mild pulmonary venous hypertension without overt edema. Small bilateral pleural effusions. Linear atelectasis in the lower lobes and right middle lobe. No confluent airspace consolidation. Degenerative changes involving the thoracic spine. IMPRESSION: 1. Small bilateral pleural effusions. 2. Linear atelectasis in the lower lobes right middle lobe. 3. Mild to moderate cardiomegaly with significant interval increase in heart size since June, 2016. Pulmonary venous hypertension without overt edema. Electronically Signed   By: Evangeline Dakin M.D.   On: 06/03/2015 14:32   Ct Head Wo Contrast  06/03/2015  CLINICAL DATA:  Dizziness and syncope EXAM: CT HEAD WITHOUT CONTRAST TECHNIQUE: Contiguous axial images were obtained from the  base of the skull through the vertex without intravenous contrast. COMPARISON:  None. FINDINGS: There is mild diffuse atrophy. There is no intracranial mass hemorrhage, extra-axial fluid collection, or midline shift. There is slight small vessel disease in the centra semiovale bilaterally. No acute infarct is evident. A small amount of benign calcification is noted in medial right temporal lobe region. The bony calvarium appears intact although somewhat osteoporotic. The mastoid air cells clear. No intraorbital lesions are evident. There are foci of calcification in the cavernous carotid artery regions as well as in both distal vertebral arteries. IMPRESSION: Mild atrophy with mild periventricular small vessel disease. No intracranial mass, hemorrhage, or evidence of acute infarct. Electronically Signed   By: Lowella Grip III M.D.   On: 06/03/2015 16:05   US Renal  06/03/2015  CLINICAL DATA:  Acute kidney injury.  Diabetes mellitus. EXAM: RENAL / URINARY TRACT ULTRASOUND COMPLETE COMPARISON:  None. FINDINGS: Right Kidney: Length: 11.7 cm. Diffuse renal parenchymal thinning and increased echogenicity noted. Several small right renal cysts also noted. No mass or hydronephrosis visualized. Left Kidney: Length: 11.6 cm. Diffuse renal parenchymal thinning and increased echogenicity noted. Bilobed cyst or 2 adjacent cysts seen in the midpole  measuring up to 4.3 x 2.3 cm. No mass or hydronephrosis visualized. Bladder: Appears normal for degree of bladder distention. Incidentally noted is borderline splenomegaly with spleen measuring approximately 13 cm in length and bilateral pleural effusions. IMPRESSION: Bilateral renal parenchymal thinning/ atrophy and increased echogenicity, consistent chronic medical renal disease. No evidence of hydronephrosis. Bilateral renal cysts. Incidentally noted bilateral pleural effusions and borderline splenomegaly. Electronically Signed   By: Earle Gell M.D.   On: 06/03/2015 18:19     Nm Pulmonary Perf And Vent  06/03/2015  CLINICAL DATA:  Persistent cough, passed out 2 days ago, bradycardia EXAM: NUCLEAR MEDICINE VENTILATION - PERFUSION LUNG SCAN TECHNIQUE: Ventilation images were obtained in multiple projections using inhaled aerosol Tc-71m DTPA. Perfusion images were obtained in multiple projections after intravenous injection of Tc-45m MAA. RADIOPHARMACEUTICALS:  30 mCi Technetium-48m DTPA aerosol inhalation and 4.4 mCi Technetium-61m MAA IV COMPARISON:  Correlation with chest radiograph dated 06/03/2015 FINDINGS: Ventilation: Heterogeneous ventilation with clumping in the central airways, compatible with COPD. Perfusion: Mildly heterogeneous perfusion. No wedge shaped peripheral perfusion defects to suggest acute pulmonary embolism. Corresponding chest radiograph demonstrates mild bibasilar atelectasis and small bilateral pleural effusions. IMPRESSION: Low probability for pulmonary embolism. Electronically Signed   By: Julian Hy M.D.   On: 06/03/2015 19:39        Scheduled Meds: . aspirin EC  81 mg Oral Daily  . cholecalciferol  1,000 Units Oral Daily  . gabapentin  300 mg Oral BID  . heparin  5,000 Units Subcutaneous Q8H  . insulin aspart  0-15 Units Subcutaneous TID WC  . multivitamin with minerals  1 tablet Oral Daily  . omega-3 acid ethyl esters  1 g Oral Daily  . pramipexole  1 mg Oral QHS  . sodium chloride flush  3 mL Intravenous Q12H  . traZODone  150 mg Oral QHS   Continuous Infusions: . sodium chloride       LOS: 1 day    Time spent: 25 minutes. Greater than 50% of this time was spent in direct contact with the patient coordinating care.     Lelon Frohlich, MD [facility] Pager [phone]  If 7PM-7AM, please contact night-coverage [email or web address] Password [email or web address] 06/04/2015, 12:11 PM

## 2015-06-04 NOTE — Care Management Note (Signed)
Case Management Note  Patient Details  Name: Dylan Williamson MRN: [id number] Date of Birth: May 29, 1934  Subjective/Objective:   Spoke with patient for discharge planning. Alert oriented and daughter present in the room. Patient stated that he lives home alone and still drives himself to appointments. PCP is Dr Nadara Mustard. Thinks he has a walker somewhere in his home but does not use any equipment to ambulate. Has family support of his 7 children . Denies issues filling medication scripts.   PT has recommended outpatient PT will set this up. No other needs assessed.             Action/Plan: Home with self care outpatient PT.   Expected Discharge Date:                  Expected Discharge Plan:     In-House Referral:     Discharge planning Services  CM Consult  Post Acute Care Choice:    Choice offered to:     DME Arranged:    DME Agency:     HH Arranged:    [place] Agency:     Status of Service:  Completed, signed off  Medicare Important Message Given:    Date Medicare IM Given:    Medicare IM give by:    Date Additional Medicare IM Given:    Additional Medicare Important Message give by:     If discussed at [place] of Stay Meetings, dates discussed:    Additional Comments:  Alvie Heidelberg, RN 06/04/2015, 2:43 PM

## 2015-06-04 NOTE — Evaluation (Signed)
Physical Therapy Evaluation Patient Details Name: Dylan Williamson MRN: [id number] DOB: 02-07-1934 Today's Date: 06/04/2015   History of Present Illness  81 yo M admitted after syncopal episode while sitting on the sofa where he reports blacking out for ~3hrs.  EKG notable for A-flutter, R BBB, L anterior fasicular block, and CXR with small B pleural effusions, mild/moderate cardiomegaly, elevated D-dimer but NM pulmonary perf & vent with low probability for PE.  PMH: neuropathy, DM2, HTN.  Clinical Impression  Pt received sitting up in the chair, granddaughter present, and pt was agreeable to PT evaluation.  Pt states that he lives alone, and is normally independent with ADL's, and IADL's.   He is able to drive, and run his errands, but uses a cart to lean on when at the grocery store.  Pt has some chronic issues with R shoulder, likely rotator cuff, that makes it difficult for him to perform AROM of R shoulder, however pt compensates by using L UE to assist the right.  Pt also has some pain and swelling in the R knee that has been going on for 3 months.  Today during PT evaluation, he required supervision/min guard for sit<>stand transfer and ambulation 19ftx2 trial.  Gait distance was limited by the pt due to pt expressing that he felt SOB with SpO2 noted to be 96% on RA with HR at 47bpm.  After 2nd gait trial he also stated he felt SOB, but not as bad, and SpO2 was 93% with HR at 87bpm.  At this point he is recommended for f/u OPPT to progress with pt's endurance and strength.     Follow Up Recommendations Outpatient PT    Equipment Recommendations  None recommended by PT    Recommendations for Other Services       Precautions / Restrictions Restrictions Weight Bearing Restrictions: No      Mobility  Bed Mobility Overal bed mobility:  (Not assessed - pt sitting up in the chair upon arrival. )                Transfers Overall transfer level: Needs assistance   Transfers: Sit  to/from Stand Sit to Stand: Supervision            Ambulation/Gait Ambulation/Gait assistance: Min guard;Supervision Ambulation Distance (Feet): 50 Feet (x2 trials) Assistive device: None Gait Pattern/deviations: Step-through pattern     General Gait Details: Gait distance limited by fatigue with pt expressing that she felt SOB.  SpO2 96% on RA with HR at 47bpm after 1st gait trial, and 93% on RA after 2nd giat trial and HR 87bpm.  Stairs            Wheelchair Mobility    Modified Rankin (Stroke Patients Only)       Balance Overall balance assessment: Needs assistance         Standing balance support: No upper extremity supported Standing balance-Leahy Scale: Good                               Pertinent Vitals/Pain Pain Assessment: No/denies pain    Home Living   Living Arrangements: Alone   Type of Home: House Home Access: Ramped entrance     Home Layout: One level;Laundry or work area in [place]: Kasandra Knudsen - single point;Walker - 2 wheels      Prior Function Level of Independence: Independent         Comments: Pt states  that he is independent with ADL's, and IADL's.  He is still driving and able to run his own errands, but needs to use a cart when at the grocery store.  Pt does not use any DME for ambulation.     Hand Dominance        Extremity/Trunk Assessment   Upper Extremity Assessment: RUE deficits/detail;LUE deficits/detail RUE Deficits / Details: Able to flex shoulder >30* actively, PROM = ~70* but limited by pain.  Elbow strength flexion/extension is 5/5.  Pt states he has to use his L hand to assist the right hand with eating.      LUE Deficits / Details: Able to flex shoulder to ~100* actively, and able to achieve PROM of ~110 before limited by pain.  Elbow strength flexion/extension is 5/5   Lower Extremity Assessment: Generalized weakness;RLE deficits/detail RLE Deficits / Details: knee lacking  ~10-15* from full extension with audible crepitus and popping heard during extension with noted swelling with +1 pitting edema.  Pt states it has been that way for ~23months.         Communication   Communication: No difficulties  Cognition Arousal/Alertness: Awake/alert Behavior During Therapy: WFL for tasks assessed/performed Overall Cognitive Status: Within Functional Limits for tasks assessed                      General Comments      Exercises        Assessment/Plan    PT Assessment Patient needs continued PT services  PT Diagnosis Difficulty walking   PT Problem List Decreased strength;Decreased range of motion;Decreased activity tolerance;Decreased balance;Decreased mobility;Decreased safety awareness;Cardiopulmonary status limiting activity;Decreased knowledge of precautions  PT Treatment Interventions Gait training;Functional mobility training;Therapeutic activities;Therapeutic exercise;Balance training;Patient/family education   PT Goals (Current goals can be found in the Care Plan section) Acute Rehab PT Goals Patient Stated Goal: Pt wants to go home PT Goal Formulation: With patient Time For Goal Achievement: 06/11/15 Potential to Achieve Goals: Good    Frequency Min 3X/week   Barriers to discharge Decreased caregiver support      Co-evaluation               End of Session Equipment Utilized During Treatment: Gait belt Activity Tolerance: Patient tolerated treatment well Patient left: in chair;with family/visitor present;with call bell/phone within reach      Functional Assessment Tool Used: [employer] "6-clicks"  Functional Limitation: Mobility: Walking and moving around Mobility: Walking and Moving Around Current Status [phone]): At least 20 percent but less than 40 percent impaired, limited or restricted Mobility: Walking and Moving Around Goal Status [phone]): At least 1 percent but less than 20 percent impaired, limited or  restricted    Time: 0923-0950 PT Time Calculation (min) (ACUTE ONLY): 27 min   Charges:   PT Evaluation $PT Eval Moderate Complexity: 1 Procedure PT Treatments $Gait Training: 8-22 mins   PT G Codes:   PT G-Codes **NOT FOR INPATIENT CLASS** Functional Assessment Tool Used: [employer] "6-clicks"  Functional Limitation: Mobility: Walking and moving around Mobility: Walking and Moving Around Current Status [phone]): At least 20 percent but less than 40 percent impaired, limited or restricted Mobility: Walking and Moving Around Goal Status [phone]): At least 1 percent but less than 20 percent impaired, limited or restricted    [facility] Ritika Hellickson, PT, DPT X: [phone]   06/04/2015, 11:13 AM

## 2015-06-04 NOTE — Progress Notes (Signed)
ANTICOAGULATION CONSULT NOTE - Initial Consult  Pharmacy Consult for eliquis Indication: atrial fibrillation  No Known Allergies  Patient Measurements: Height: 6\' 2"  (188 cm) Weight: 208 lb 1.6 oz (94.394 kg) IBW/kg (Calculated) : 82.2   Vital Signs: Temp: 98.4 F (36.9 C) (05/31 0554) Temp Source: Oral (05/31 0554) BP: 114/65 mmHg (05/31 0940) Pulse Rate: 87 (05/31 0945)  Labs:  Recent Labs  06/03/15 1404 06/04/15 0601  HGB 9.7*  --   HCT 31.0*  --   PLT 221  --   CREATININE 3.20* 2.99*  TROPONINI <0.03  --     Estimated Creatinine Clearance: 23.7 mL/min (by C-G formula based on Cr of 2.99).   Medical History: Past Medical History  Diagnosis Date  . Neuropathy ([place])   . Diabetes mellitus without complication ([place])     Medications:  Prescriptions prior to admission  Medication Sig Dispense Refill Last Dose  . aspirin 81 MG tablet Take 81 mg by mouth daily.   06/03/2015 at Unknown time  . cholecalciferol (VITAMIN D) 1000 units tablet Take 1,000 Units by mouth daily.   06/03/2015 at Unknown time  . gabapentin (NEURONTIN) 300 MG capsule Take 300 mg by mouth 2 (two) times daily.   06/03/2015 at Unknown time  . glyBURIDE (DIABETA) 5 MG tablet Take 5 mg by mouth 2 (two) times daily with a meal.   06/03/2015 at Unknown time  . lisinopril (PRINIVIL,ZESTRIL) 40 MG tablet Take 40 mg by mouth daily.   06/02/2015 at 2100  . Multiple Vitamins-Minerals (MULTIVITAMIN WITH MINERALS) tablet Take 1 tablet by mouth daily.   06/03/2015 at Unknown time  . Omega-3 Fatty Acids (FISH OIL) 1000 MG CAPS Take 1 capsule by mouth 2 (two) times daily.   06/03/2015 at Unknown time  . pramipexole (MIRAPEX) 1 MG tablet Take 1 mg by mouth at bedtime.   06/02/2015 at Unknown time  . traZODone (DESYREL) 150 MG tablet Take by mouth at bedtime.   06/02/2015 at Unknown time    Assessment: 81 yo man to start eliquis for afib.  His SrCr is elevated at 2.99 but he does not meet criteria for reduced dose of  eliquis.   Goal of Therapy:  Appropriate anticoagulation Monitor platelets by anticoagulation protocol: Yes   Plan:  Eliquis 5 mg po bid Monitor for bleeding complications  Thanks for allowing pharmacy to be a part of this patient's care.  Excell Seltzer, PharmD Clinical Pharmacist 06/04/2015,12:24 PM

## 2015-06-04 NOTE — Consult Note (Addendum)
Primary cardiologist: N/A Consulting cardiologist: Dr Carlyle Dolly MD Requesting Physcian: Dr Lelon Frohlich Indication: syncope, abnormal EKG   Clinical Summary Mr. Alpizar is a 81 y.o.male history of HTN, DM2, admitted with possible syncope. He reports on Sunday he had just sat down to watch the car race on tv. He states the next thing he knew it was 3 hours later. He wasn't sure if he fell asleep or passed out. He stood up and felt very lightheaded and dizzy, unsteady on his feet. Denies any chest pain or palpitations. Denies any prior episodes of dizziness or passing out. He has also noted over the last several weeks increasing SOB, LE edema, and abdominal distension.     D-dimer 2.49, BNP 141, TSH 3.2, Mg 2.1, trop neg, Cr 3.20 (no baseline), BUN 42, K 4.9, Hgb 9.7, Plt 221,  CXR small bilateral effusions, cardiomegaly CT head no acute process VQ scan: low probability PE Echo pending EKG afib, RBBB, LPFB  No Known Allergies  Medications Scheduled Medications: . cholecalciferol  1,000 Units Oral Daily  . gabapentin  300 mg Oral BID  . heparin  5,000 Units Subcutaneous Q8H  . insulin aspart  0-15 Units Subcutaneous TID WC  . multivitamin with minerals  1 tablet Oral Daily  . omega-3 acid ethyl esters  1 g Oral Daily  . pramipexole  1 mg Oral QHS  . sodium chloride flush  3 mL Intravenous Q12H  . traZODone  150 mg Oral QHS     Infusions: . sodium chloride       PRN Medications:  acetaminophen **OR** acetaminophen, bisacodyl, HYDROcodone-acetaminophen, ondansetron **OR** ondansetron (ZOFRAN) IV, polyethylene glycol   Past Medical History  Diagnosis Date  . Neuropathy ([place])   . Diabetes mellitus without complication ([place])     History reviewed. No pertinent past surgical history.  Family History  Problem Relation Age of Onset  . Family history unknown: Yes    Social History Mr. Roughton reports that he has been smoking Cigarettes.  He has  been smoking about 1.00 pack per day. He does not have any smokeless tobacco history on file. Mr. Vanvalken[place] reports that he drinks alcohol.  Review of Systems CONSTITUTIONAL: No weight loss, fever, chills, weakness or fatigue.  HEENT: Eyes: No visual loss, blurred vision, double vision or yellow sclerae. No hearing loss, sneezing, congestion, runny nose or sore throat.  SKIN: No rash or itching.  CARDIOVASCULAR: per HPI RESPIRATORY: No shortness of breath, cough or sputum.  GASTROINTESTINAL: No anorexia, nausea, vomiting or diarrhea. No abdominal pain or blood.  GENITOURINARY: no polyuria, no dysuria NEUROLOGICAL: per HPI MUSCULOSKELETAL: No muscle, back pain, joint pain or stiffness.  HEMATOLOGIC: No anemia, bleeding or bruising.  LYMPHATICS: No enlarged nodes. No history of splenectomy.  PSYCHIATRIC: No history of depression or anxiety.      Physical Examination Blood pressure 114/65, pulse 87, temperature 98.4 F (36.9 C), temperature source Oral, resp. rate 18, height 6\' 2"  (1.88 m), weight 208 lb 1.6 oz (94.394 kg), SpO2 93 %.  Intake/Output Summary (Last 24 hours) at 06/04/15 1218 Last data filed at 06/04/15 0900  Gross per 24 hour  Intake    240 ml  Output      0 ml  Net    240 ml    HEENT: sclera clear, throat clear  Cardiovascular: irreg, no m/r/g, elevated JVD  Respiratory: mild crackles bilateral base  GI: abdomen soft, NT, ND  MSK: 1+bilateral LE edema  Neuro: no focal deficits  Psych: appropriate affect   Lab Results  Basic Metabolic Panel:  Recent Labs Lab 06/03/15 1404 06/04/15 0601  NA 135 136  K 4.9 4.8  CL 104 103  CO2 21* 21*  GLUCOSE 175* 137*  BUN 42* 43*  CREATININE 3.20* 2.99*  CALCIUM 8.9 8.6*  MG 2.1  --   PHOS 4.0  --     Liver Function Tests:  Recent Labs Lab 06/03/15 1404  AST 24  ALT 19  ALKPHOS 97  BILITOT 0.7  PROT 7.3  ALBUMIN 3.1*    CBC:  Recent Labs Lab 06/03/15 1404  WBC 8.2  NEUTROABS 6.1  HGB  9.7*  HCT 31.0*  MCV 90.6  PLT 221    Cardiac Enzymes:  Recent Labs Lab 06/03/15 1404  TROPONINI <0.03    BNP: Invalid input(s): POCBNP     Impression/Recommendations 1. Afib - new diagnosis. Currently normal rates, he is not on any av nodal agents at this time. With evidence of underlying conduction disease and normal rates will not start av nodal agent at this time.  - echo is pending - CHADS2Vasc score of 4, we will start eliquis.   2. Syncope - unclear etiology at this time. We will order orthostatic vital signs, though appears he may have received some IVFs - unclear if new diagnosis of afib played a role in his episode. He also has evidence of undelrying conduction disease on his EKG with RBBB and LPFB, he bradycardia must also be considered as cause - telemetry shows episodes of bradycardia at night, with up to 2 second pauses and at times what looks to be a ventricular escape rhythm. He will need outpatient sleep study.  - follow telemetry during admission, likely will need outpatient monitor unless something significant noted on tele while in hospital  3. Renal dysfunction - unclear baseline, renal US shows chronic changes - workup per primary team.   4. LE edema - reports increased LE edema, abdominal distension, and DOE over the last several weeks - echo is pending. We will d/c IVFs at this time given his exam findings, f/u echo.      Carlyle Dolly, M.D.

## 2015-06-05 ENCOUNTER — Ambulatory Visit (INDEPENDENT_AMBULATORY_CARE_PROVIDER_SITE_OTHER): Payer: Medicare Other

## 2015-06-05 ENCOUNTER — Other Ambulatory Visit: Payer: Self-pay | Admitting: *Deleted

## 2015-06-05 ENCOUNTER — Telehealth: Payer: Self-pay

## 2015-06-05 ENCOUNTER — Inpatient Hospital Stay (HOSPITAL_COMMUNITY): Payer: Medicare Other

## 2015-06-05 DIAGNOSIS — I4892 Unspecified atrial flutter: Secondary | ICD-10-CM

## 2015-06-05 DIAGNOSIS — N289 Disorder of kidney and ureter, unspecified: Secondary | ICD-10-CM

## 2015-06-05 DIAGNOSIS — I4891 Unspecified atrial fibrillation: Secondary | ICD-10-CM

## 2015-06-05 LAB — BASIC METABOLIC PANEL
ANION GAP: 6 (ref 5–15)
BUN: 39 mg/dL — AB (ref 6–20)
CO2: 23 mmol/L (ref 22–32)
Calcium: 8.8 mg/dL — ABNORMAL LOW (ref 8.9–10.3)
Chloride: 107 mmol/L (ref 101–111)
Creatinine, Ser: 2.59 mg/dL — ABNORMAL HIGH (ref 0.61–1.24)
GFR, EST AFRICAN AMERICAN: 26 mL/min — AB (ref >60)
GFR, EST NON AFRICAN AMERICAN: 22 mL/min — AB (ref >60)
Glucose, Bld: 98 mg/dL (ref 65–99)
Potassium: 4.8 mmol/L (ref 3.5–5.1)
SODIUM: 136 mmol/L (ref 135–145)

## 2015-06-05 LAB — ECHOCARDIOGRAM COMPLETE
Height: 74 in
WEIGHTICAEL: 3361.6 [oz_av]

## 2015-06-05 LAB — UREA NITROGEN, URINE: UREA NITROGEN UR: 645 mg/dL

## 2015-06-05 LAB — GLUCOSE, CAPILLARY
GLUCOSE-CAPILLARY: 97 mg/dL (ref 65–99)
GLUCOSE-CAPILLARY: 182 mg/dL — AB (ref 65–99)

## 2015-06-05 MED ORDER — APIXABAN 5 MG PO TABS: 5.0000 mg | ORAL_TABLET | Freq: Two times a day (BID) | ORAL | Status: DC

## 2015-06-05 MED ORDER — APIXABAN 5 MG PO TABS
5.0000 mg | ORAL_TABLET | Freq: Two times a day (BID) | ORAL | Status: DC
  Administered 2015-06-05: 5 mg via ORAL
  Filled 2015-06-05: qty 1

## 2015-06-05 NOTE — Telephone Encounter (Signed)
-----   Message from Orinda Kenner sent at 06/05/2015  1:11 PM EDT ----- Regarding: 30 Day Event Monitor Patient is being discharged home today from [facility]. Dr.Branch has ordered 30 day event monitor for bradycardia. Will you set this up and have it sent to his home please.  Thanks, [employer]

## 2015-06-05 NOTE — Consult Note (Signed)
   [facility] Inpatient Consult   06/05/2015  Dylan Williamson 1934-01-26 [id number]  Patient evaluated for community based chronic disease management services with [facility] as a benefit of patient's Medicare. Spoke with patient and daughter [name] at bedside to explain [place] [facility] services. Patient agrees to participate and understands he will receive post hospital discharge call and will be evaluated for monthly home visits for assessments and disease process education. Consent obtained. Left contact information and [facility] literature at bedside.  Made Inpatient Case Manager aware that [place] [facility] following.  Of note, [facility] Care Management services does not replace or interfere with any services that are arranged by inpatient case management or social work.   For additional questions or referrals please contact:  Royetta Crochet. [name], RN, BSN, [place] [phone]) Business Cell  [phone]) Toll Free Office

## 2015-06-05 NOTE — Discharge Summary (Addendum)
Physician Discharge Summary  Dylan Williamson [id number] DOB: 1934/09/27 DOA: 06/03/2015  PCP: Rory Percy, MD  Admit date: 06/03/2015 Discharge date: 06/05/2015  Time spent: 45 minutes  Recommendations for Outpatient Follow-up:  -Will be discharged home today. -Has f/u with cardiology in 3 weeks scheduled.   Discharge Diagnoses:  Principal Problem:   Syncope Active Problems:   Atrial flutter (HCC)   Kidney disease   Normocytic anemia   Hypertension   Non-insulin dependent type 2 diabetes mellitus ([place])   Insomnia   Discharge Condition: Stable and improved  Filed Weights   06/03/15 1400 06/04/15 0709 06/05/15 0700  Weight: 92.08 kg (203 lb) 94.394 kg (208 lb 1.6 oz) 95.301 kg (210 lb 1.6 oz)    History of present illness:  As per Dr. Myna Hidalgo on 5/30: [name] is a 81 y.o. male with medical history significant for hypertension, type 2 diabetes mellitus with peripheral neuropathy, and insomnia who presents to the ED at the direction of his PCP for evaluation of a syncopal episode that occurred the night of 06/01/2015. Patient reports pain in his usual state of health when he was sitting on his couch preparing to watch a car race on TV when he believes he "blacked out," waking ~3 hours later. Patient denies any preceding symptoms such as chest pain, palpitations, lightheadedness, nausea, or sweats. He reports being in his usual state of health since time of that episode. He denies any recent fevers, chills, long distance travel, or sick contacts. He has had no dyspnea or cough and denies headaches, focal numbness or weakness, or loss of coordination. He has never experienced a similar episode previously. He was hesitant to come to the emergency department, but eventually agreed at the urging of his daughter and PCP.  ED Course: Upon arrival to the ED, patient is found to be afebrile, saturating well on room air, and with vital signs stable. EKG features atrial flutter  with right bundle branch block and left anterior fascicular block. Chest x-ray demonstrates small bilateral pleural effusions and mild to moderate cardiomegaly with significant increase since the prior study one year ago. Head CT is negative for acute intracranial abnormality. CMP returns notable for BUN of 42 and serum creatinine of 3.20 with unknown baseline. CBC features a hemoglobin of 9.7 with normal MCV. Troponin is undetectable and BNP is mildly elevated 241. Patient was monitored on telemetry in the emergency department, remained in atrial arrhythmia, TSH was normal, and a d-dimer was obtained by the EDP. D-dimer turns elevated at 2.49 and VQ scan has been ordered. Patient has remained hemodynamically stable in the emergency department and will be admitted to the hospital for ongoing evaluation and management of syncopal episode with apparent new onset atrial flutter and kidney disease of unknown chronicity.   Hospital Course:   New onset atrial fibrillation -Discussed EKG and telemetry with Dr. Harl Bowie. He believes this likely represents atrial fibrillation. - Unclear if this is the etiology for his syncope but so far workup has been otherwise negative. -Has been started on Levaquin is for stroke prevention. -Per cardiology recommendations will be discharged on a 30 day event monitor. -He has had some bradycardia and pauses on telemetry, unclear if this could potentially be the cause of his syncope  Diabetes mellitus -Fair control  Hypertension -Well-controlled  Chronic kidney disease -Unknown chronicity, however creatinine 3.2 on admission down to 2.59 on DC, suspect some degree of chronic kidney dysfunction given renal ultrasound with cortical thinning suggestive of chronic  medical renal disease. Hold lisinopril and avoid nephrotoxins. -Will need close follow-up of his kidney function in the outpatient setting.   Procedures: ECHO: Left ventricle: The cavity size was normal. Wall  thickness was  increased in a pattern of mild LVH. Systolic function was normal.  The estimated ejection fraction was in the range of 55% to 60%.  Wall motion was normal; there were no regional wall motion  abnormalities. The study is not technically sufficient to allow   evaluation of LV diastolic function.    Consultations:  Cardiology  Discharge Instructions  Discharge Instructions    AMB Referral to [facility]    Complete by:  As directed   Please refer patient to [facility] for transition of care calls  Please refer to [facility] Pharmacist for medication assistance with part D plan and affordability.  Consent obtained at beside.  For questions or concerns contact:  Royetta Crochet. N[place]czura, RN, BSN, [place] [phone]) Business Cell  [phone]) Toll Free Office  Reason for consult:  [facility] consult  Diagnoses of:  Diabetes  Expected date of contact:  1-3 days (reserved for hospital discharges)     Diet - low sodium heart healthy    Complete by:  As directed      Increase activity slowly    Complete by:  As directed             Medication List    STOP taking these medications        aspirin 81 MG tablet     lisinopril 40 MG tablet  Commonly known as:  PRINIVIL,ZESTRIL      TAKE these medications        apixaban 5 MG Tabs tablet  Commonly known as:  ELIQUIS  Take 1 tablet (5 mg total) by mouth 2 (two) times daily.     cholecalciferol 1000 units tablet  Commonly known as:  VITAMIN D  Take 1,000 Units by mouth daily.     Fish Oil 1000 MG Caps  Take 1 capsule by mouth 2 (two) times daily.     gabapentin 300 MG capsule  Commonly known as:  NEURONTIN  Take 300 mg by mouth 2 (two) times daily.     glyBURIDE 5 MG tablet  Commonly known as:  DIABETA  Take 5 mg by mouth 2 (two) times daily with a meal.     multivitamin with minerals tablet  Take 1 tablet by mouth daily.     pramipexole 1 MG tablet    Commonly known as:  MIRAPEX  Take 1 mg by mouth at bedtime.     traZODone 150 MG tablet  Commonly known as:  DESYREL  Take by mouth at bedtime.       No Known Allergies     Follow-up Information    Follow up with Carlyle Dolly, MD On 07/03/2015.   Specialty:  Cardiology   Why:  2:40   Contact information:   [place] [address] [phone]        The results of significant diagnostics from this hospitalization (including imaging, microbiology, ancillary and laboratory) are listed below for reference.    Significant Diagnostic Studies: Dg Chest 2 View  06/03/2015  CLINICAL DATA:  81 year old current smoker with hypertension and diabetes, presenting with bradycardia. EXAM: CHEST  2 VIEW COMPARISON:  06/27/2014. FINDINGS: Cardiac silhouette mildly to moderately enlarged with significant interval increase in heart size since the  prior examination. Thoracic aorta tortuous and atherosclerotic, unchanged. Hilar and mediastinal contours otherwise unremarkable. Emphysematous changes in the upper lobes. Mild pulmonary venous hypertension without overt edema. Small bilateral pleural effusions. Linear atelectasis in the lower lobes and right middle lobe. No confluent airspace consolidation. Degenerative changes involving the thoracic spine. IMPRESSION: 1. Small bilateral pleural effusions. 2. Linear atelectasis in the lower lobes right middle lobe. 3. Mild to moderate cardiomegaly with significant interval increase in heart size since June, 2016. Pulmonary venous hypertension without overt edema. Electronically Signed   By: Evangeline Dakin M.D.   On: 06/03/2015 14:32   Ct Head Wo Contrast  06/03/2015  CLINICAL DATA:  Dizziness and syncope EXAM: CT HEAD WITHOUT CONTRAST TECHNIQUE: Contiguous axial images were obtained from the base of the skull through the vertex without intravenous contrast. COMPARISON:  None. FINDINGS: There is mild diffuse atrophy. There is no  intracranial mass hemorrhage, extra-axial fluid collection, or midline shift. There is slight small vessel disease in the centra semiovale bilaterally. No acute infarct is evident. A small amount of benign calcification is noted in medial right temporal lobe region. The bony calvarium appears intact although somewhat osteoporotic. The mastoid air cells clear. No intraorbital lesions are evident. There are foci of calcification in the cavernous carotid artery regions as well as in both distal vertebral arteries. IMPRESSION: Mild atrophy with mild periventricular small vessel disease. No intracranial mass, hemorrhage, or evidence of acute infarct. Electronically Signed   By: Lowella Grip III M.D.   On: 06/03/2015 16:05   US Renal  06/03/2015  CLINICAL DATA:  Acute kidney injury.  Diabetes mellitus. EXAM: RENAL / URINARY TRACT ULTRASOUND COMPLETE COMPARISON:  None. FINDINGS: Right Kidney: Length: 11.7 cm. Diffuse renal parenchymal thinning and increased echogenicity noted. Several small right renal cysts also noted. No mass or hydronephrosis visualized. Left Kidney: Length: 11.6 cm. Diffuse renal parenchymal thinning and increased echogenicity noted. Bilobed cyst or 2 adjacent cysts seen in the midpole measuring up to 4.3 x 2.3 cm. No mass or hydronephrosis visualized. Bladder: Appears normal for degree of bladder distention. Incidentally noted is borderline splenomegaly with spleen measuring approximately 13 cm in length and bilateral pleural effusions. IMPRESSION: Bilateral renal parenchymal thinning/ atrophy and increased echogenicity, consistent chronic medical renal disease. No evidence of hydronephrosis. Bilateral renal cysts. Incidentally noted bilateral pleural effusions and borderline splenomegaly. Electronically Signed   By: Earle Gell M.D.   On: 06/03/2015 18:19   Nm Pulmonary Perf And Vent  06/03/2015  CLINICAL DATA:  Persistent cough, passed out 2 days ago, bradycardia EXAM: NUCLEAR MEDICINE  VENTILATION - PERFUSION LUNG SCAN TECHNIQUE: Ventilation images were obtained in multiple projections using inhaled aerosol Tc-30m DTPA. Perfusion images were obtained in multiple projections after intravenous injection of Tc-60m MAA. RADIOPHARMACEUTICALS:  30 mCi Technetium-75m DTPA aerosol inhalation and 4.4 mCi Technetium-11m MAA IV COMPARISON:  Correlation with chest radiograph dated 06/03/2015 FINDINGS: Ventilation: Heterogeneous ventilation with clumping in the central airways, compatible with COPD. Perfusion: Mildly heterogeneous perfusion. No wedge shaped peripheral perfusion defects to suggest acute pulmonary embolism. Corresponding chest radiograph demonstrates mild bibasilar atelectasis and small bilateral pleural effusions. IMPRESSION: Low probability for pulmonary embolism. Electronically Signed   By: Julian Hy M.D.   On: 06/03/2015 19:39    Microbiology: No results found for this or any previous visit (from the past 240 hour(s)).   Labs: Basic Metabolic Panel:  Recent Labs Lab 06/03/15 1404 06/04/15 0601 06/05/15 0650  NA 135 136 136  K 4.9 4.8 4.8  CL 104 103 107  CO2 21* 21* 23  GLUCOSE 175* 137* 98  BUN 42* 43* 39*  CREATININE 3.20* 2.99* 2.59*  CALCIUM 8.9 8.6* 8.8*  MG 2.1  --   --   PHOS 4.0  --   --    Liver Function Tests:  Recent Labs Lab 06/03/15 1404  AST 24  ALT 19  ALKPHOS 97  BILITOT 0.7  PROT 7.3  ALBUMIN 3.1*   No results for input(s): LIPASE, AMYLASE in the last 168 hours. No results for input(s): AMMONIA in the last 168 hours. CBC:  Recent Labs Lab 06/03/15 1404 06/03/15 1721  WBC 8.2  --   NEUTROABS 6.1  --   HGB 9.7*  --   HCT 31.0* 29.2*  MCV 90.6  --   PLT 221  --    Cardiac Enzymes:  Recent Labs Lab 06/03/15 1404  TROPONINI <0.03   BNP: BNP (last 3 results)  Recent Labs  06/03/15 1404  BNP 141.0*    ProBNP (last 3 results) No results for input(s): PROBNP in the last 8760 hours.  CBG:  Recent  Labs Lab 06/04/15 1117 06/04/15 1615 06/04/15 2128 06/05/15 0722 06/05/15 1111  GLUCAP 165* 81 103* 97 182*       Signed:  [facility] Pager: [phone] 06/05/2015, 1:27 PM

## 2015-06-05 NOTE — Telephone Encounter (Signed)
Informed by 3 rd floor secretary that pt was being discharged and would send him down to cardiology to have Event monitor applied.Wife will bring by upon discharge

## 2015-06-05 NOTE — Telephone Encounter (Signed)
21 day event monitor per Arnold Long NP  We will give pt eliquis samples

## 2015-06-05 NOTE — Progress Notes (Signed)
*  PRELIMINARY RESULTS* Echocardiogram 2D Echocardiogram has been performed.  Dylan Williamson 06/05/2015, 12:53 PM

## 2015-06-05 NOTE — Progress Notes (Addendum)
Primary Cardiologist: Carlyle Dolly MD  Cardiology Specific Problem List: 1. Atrial fib/flutter 2. Syncope 3. Hypertension  Subjective:    Anxious to go home. No complaints of dizziness, near syncope or chest pain.   Objective:   Temp:  [97.4 F (36.3 C)-97.9 F (36.6 C)] 97.4 F (36.3 C) (06/01 0700) Pulse Rate:  [47-87] 84 (06/01 0700) Resp:  [20] 20 (06/01 0700) BP: (105-115)/(50-74) 108/61 mmHg (06/01 0700) SpO2:  [92 %-96 %] 92 % (06/01 0700) Weight:  [210 lb 1.6 oz (95.301 kg)] 210 lb 1.6 oz (95.301 kg) (06/01 0700) Last BM Date: 06/02/15  Filed Weights   06/03/15 1400 06/04/15 0709 06/05/15 0700  Weight: 203 lb (92.08 kg) 208 lb 1.6 oz (94.394 kg) 210 lb 1.6 oz (95.301 kg)    Intake/Output Summary (Last 24 hours) at 06/05/15 0830 Last data filed at 06/04/15 1800  Gross per 24 hour  Intake    720 ml  Output      2 ml  Net    718 ml    Telemetry: Atrial flutter, with significant bradycardia overnight, down into the 30's.   Exam:  General: No acute distress.  HEENT: Conjunctiva and lids normal, oropharynx clear.  Lungs: Bilateral crackles in the bases, cleared with coughing  Cardiac: No elevated JVP or bruits. IRRR, no gallop or rub.   Abdomen: Normoactive bowel sounds, nontender, nondistended.  Extremities: No pitting edema, distal pulses full.  Neuropsychiatric: Alert and oriented x3, affect appropriate.   Lab Results:  Basic Metabolic Panel:  Recent Labs Lab 06/03/15 1404 06/04/15 0601 06/05/15 0650  NA 135 136 136  K 4.9 4.8 4.8  CL 104 103 107  CO2 21* 21* 23  GLUCOSE 175* 137* 98  BUN 42* 43* 39*  CREATININE 3.20* 2.99* 2.59*  CALCIUM 8.9 8.6* 8.8*  MG 2.1  --   --     Liver Function Tests:  Recent Labs Lab 06/03/15 1404  AST 24  ALT 19  ALKPHOS 97  BILITOT 0.7  PROT 7.3  ALBUMIN 3.1*    CBC:  Recent Labs Lab 06/03/15 1404 06/03/15 1721  WBC 8.2  --   HGB 9.7*  --   HCT 31.0* 29.2*  MCV 90.6  --   PLT  221  --     Cardiac Enzymes:  Recent Labs Lab 06/03/15 1404  TROPONINI <0.03    Radiology: Dg Chest 2 View  06/03/2015  CLINICAL DATA:  81 year old current smoker with hypertension and diabetes, presenting with bradycardia. EXAM: CHEST  2 VIEW COMPARISON:  06/27/2014. FINDINGS: Cardiac silhouette mildly to moderately enlarged with significant interval increase in heart size since the prior examination. Thoracic aorta tortuous and atherosclerotic, unchanged. Hilar and mediastinal contours otherwise unremarkable. Emphysematous changes in the upper lobes. Mild pulmonary venous hypertension without overt edema. Small bilateral pleural effusions. Linear atelectasis in the lower lobes and right middle lobe. No confluent airspace consolidation. Degenerative changes involving the thoracic spine. IMPRESSION: 1. Small bilateral pleural effusions. 2. Linear atelectasis in the lower lobes right middle lobe. 3. Mild to moderate cardiomegaly with significant interval increase in heart size since June, 2016. Pulmonary venous hypertension without overt edema. Electronically Signed   By: Evangeline Dakin M.D.   On: 06/03/2015 14:32   Ct Head Wo Contrast  06/03/2015  CLINICAL DATA:  Dizziness and syncope EXAM: CT HEAD WITHOUT CONTRAST TECHNIQUE: Contiguous axial images were obtained from the base of the skull through the vertex without intravenous contrast. COMPARISON:  None. FINDINGS: There  is mild diffuse atrophy. There is no intracranial mass hemorrhage, extra-axial fluid collection, or midline shift. There is slight small vessel disease in the centra semiovale bilaterally. No acute infarct is evident. A small amount of benign calcification is noted in medial right temporal lobe region. The bony calvarium appears intact although somewhat osteoporotic. The mastoid air cells clear. No intraorbital lesions are evident. There are foci of calcification in the cavernous carotid artery regions as well as in both distal  vertebral arteries. IMPRESSION: Mild atrophy with mild periventricular small vessel disease. No intracranial mass, hemorrhage, or evidence of acute infarct. Electronically Signed   By: Lowella Grip III M.D.   On: 06/03/2015 16:05   US Renal  06/03/2015  CLINICAL DATA:  Acute kidney injury.  Diabetes mellitus. EXAM: RENAL / URINARY TRACT ULTRASOUND COMPLETE COMPARISON:  None. FINDINGS: Right Kidney: Length: 11.7 cm. Diffuse renal parenchymal thinning and increased echogenicity noted. Several small right renal cysts also noted. No mass or hydronephrosis visualized. Left Kidney: Length: 11.6 cm. Diffuse renal parenchymal thinning and increased echogenicity noted. Bilobed cyst or 2 adjacent cysts seen in the midpole measuring up to 4.3 x 2.3 cm. No mass or hydronephrosis visualized. Bladder: Appears normal for degree of bladder distention. Incidentally noted is borderline splenomegaly with spleen measuring approximately 13 cm in length and bilateral pleural effusions. IMPRESSION: Bilateral renal parenchymal thinning/ atrophy and increased echogenicity, consistent chronic medical renal disease. No evidence of hydronephrosis. Bilateral renal cysts. Incidentally noted bilateral pleural effusions and borderline splenomegaly. Electronically Signed   By: Earle Gell M.D.   On: 06/03/2015 18:19   Nm Pulmonary Perf And Vent  06/03/2015  CLINICAL DATA:  Persistent cough, passed out 2 days ago, bradycardia EXAM: NUCLEAR MEDICINE VENTILATION - PERFUSION LUNG SCAN TECHNIQUE: Ventilation images were obtained in multiple projections using inhaled aerosol Tc-54m DTPA. Perfusion images were obtained in multiple projections after intravenous injection of Tc-32m MAA. RADIOPHARMACEUTICALS:  30 mCi Technetium-98m DTPA aerosol inhalation and 4.4 mCi Technetium-19m MAA IV COMPARISON:  Correlation with chest radiograph dated 06/03/2015 FINDINGS: Ventilation: Heterogeneous ventilation with clumping in the central airways, compatible  with COPD. Perfusion: Mildly heterogeneous perfusion. No wedge shaped peripheral perfusion defects to suggest acute pulmonary embolism. Corresponding chest radiograph demonstrates mild bibasilar atelectasis and small bilateral pleural effusions. IMPRESSION: Low probability for pulmonary embolism. Electronically Signed   By: Julian Hy M.D.   On: 06/03/2015 19:39    Echocardiogram: Pending    Medications:   Scheduled Medications: . cholecalciferol  1,000 Units Oral Daily  . gabapentin  300 mg Oral BID  . insulin aspart  0-15 Units Subcutaneous TID WC  . multivitamin with minerals  1 tablet Oral Daily  . nicotine  14 mg Transdermal Daily  . omega-3 acid ethyl esters  1 g Oral Daily  . pramipexole  1 mg Oral QHS  . sodium chloride flush  3 mL Intravenous Q12H  . traZODone  150 mg Oral QHS     Infusions:     PRN Medications:  acetaminophen **OR** acetaminophen, bisacodyl, HYDROcodone-acetaminophen, ondansetron **OR** ondansetron (ZOFRAN) IV, polyethylene glycol   Assessment and Plan:   1.Afib Slow ventricular response overnight with pauses.  Now on Eliquis but no AV nodal blocking agents. Awaiting echo today.   2. Hypertension: Low normal. Not on antihypertensives.   3. Syncope: No further episodes since admission.  Phill Myron. Lawrence NP [place]  06/05/2015, 8:30 AM    Patient seen and discussed with NP Purcell Nails, I agree with her documentation. New diagnosis of  afib, started on eliquis yesterday. He has not required any av nodal agents. Unclear etiology of syncope at this time. Orthostatics negative though this was after IVFs. Echo is still pending. Bradycardia noted in the early AM hours with afib and up to 2.4 second pauses but no daytime episodes. With his underlying RBBB and LPFB he has evidence of conduction disease. Unclear if a brady or tachyarrhythmia was the cause of his episode, we will plan for a 3 week outpatient monitor. F/u echo results this afternoon, likely  discharge at that time.  He will need outpatient sleep study. After 3 weeks of anticoag can consider outpatient DCCV. Renal dysfunction, unclear baseline, defer further workup to primary team.  F/u echo. Prior to discharge our office will place a 21 day event monitor. He will also need assistance from care management with obtaining eliquis. Long term cost may be an issue, we will need to consider applying for drug assistance or in the long run changing to coumadin.    Zandra Abts MD

## 2015-06-05 NOTE — Progress Notes (Signed)
Discharge instructions reviewed with patient and his daughter by Karolee Ohs RN. Pt discharged with daughter to heart care outpatient for heart monitor placement.

## 2015-06-06 ENCOUNTER — Encounter: Payer: Self-pay | Admitting: *Deleted

## 2015-06-06 ENCOUNTER — Other Ambulatory Visit: Payer: Self-pay

## 2015-06-06 ENCOUNTER — Other Ambulatory Visit: Payer: Self-pay | Admitting: *Deleted

## 2015-06-06 NOTE — Patient Outreach (Signed)
I called Dylan Williamson to make sure he had his Eliquis and to discuss the Part D plan options.  He stated he did have Eliquis.  I started to discuss Part D and he asked if I could discuss it with his daughter who would be at his home on Tuesday, June 10, 2015.  I will make a home visit then.  He stated he appreciated me talking with her about the Part D plans with her.    Deanne Coffer, PharmD, [place] [phone]

## 2015-06-06 NOTE — Patient Outreach (Addendum)
06/06/15- Pt discharged from [facility] on 06/05/15 after syncopal episode.  Telephone call to pt for transition of care week 1, spoke with pt, HIPAA verified, pt reports his daughter assists with transportation and other aspects of pt care, pt lives alone, pt states he is ambulating well, does not use assistive device.  RN CM faxed transition of care note and barrier letter to primary MD Dr. Nadara Mustard.  Medications Reviewed Today    Reviewed by Kassie Mends, RN (Registered Nurse) on 06/06/15 at 2126  Med List Status: <None>   Medication Order Taking? Sig Documenting Provider Last Dose Status Informant   apixaban (ELIQUIS) 5 MG TABS tablet [id number] Yes Take 1 tablet (5 mg total) by mouth 2 (two) times daily. Erline Hau, MD Taking Active    cholecalciferol (VITAMIN D) 1000 units tablet [id number] Yes Take 1,000 Units by mouth daily. Historical Provider, MD Taking Active Self   gabapentin (NEURONTIN) 300 MG capsule [id number] Yes Take 300 mg by mouth 2 (two) times daily. Historical Provider, MD Taking Active Self   glyBURIDE (DIABETA) 5 MG tablet [id number] Yes Take 5 mg by mouth 2 (two) times daily with a meal. Historical Provider, MD Taking Active Self   Multiple Vitamins-Minerals (MULTIVITAMIN WITH MINERALS) tablet [id number] Yes Take 1 tablet by mouth daily. Historical Provider, MD Taking Active Self   Omega-3 Fatty Acids (FISH OIL) 1000 MG CAPS [id number] Yes Take 1 capsule by mouth 2 (two) times daily. Historical Provider, MD Taking Active Self   pramipexole (MIRAPEX) 1 MG tablet [id number] Yes Take 1 mg by mouth at bedtime. Historical Provider, MD Taking Active Self   traZODone (DESYREL) 150 MG tablet [id number] Yes Take by mouth at bedtime. Historical Provider, MD Taking Active Self          [facility] CM Care Plan Problem One        Most Recent Value   Care Plan Problem One  Pt high risk for hospital readmission related to disease processes (Atrial Fibrillation/ syncope)   Role  Documenting the Problem One  Care Management [place] for Problem One  Active   [facility]N Long Term Goal (31-90 days)  pt will verbalize better understanding of disease processes and have no hospital readmissions within 90 days   THN Long Term Goal Start Date  06/06/15   Interventions for Problem One Long Term Goal  RN CM reviewed importance of calling MD early for change in health status, pt has 24 hour nurse line magnet.   THN CM Short Term Goal #1 (0-30 days)  pt will attend all upcoming MD appointments for month of June within 30 days.   THN CM Short Term Goal #1 Start Date  06/06/15   Interventions for Short Term Goal #1  RN CM reviewed all MD appointments for June and importance of keeping all appointments, ask pt to take medications to MD visits   THN CM Short Term Goal #2 (0-30 days)  pt will verbalize medicaitons for atrial fibrillation within 30 days   THN CM Short Term Goal #2 Start Date  06/06/15   Interventions for Short Term Goal #2  RN CM reviewed all medicaitons with pt, purpose and dosage      PLAN Follow up with weekly transition of care calls See pt for home visit 06/23/15 (to accomodate  pt schedule)  Jacqlyn Larsen [facility], [place] Coordinator [phone]

## 2015-06-09 ENCOUNTER — Telehealth: Payer: Self-pay | Admitting: Cardiology

## 2015-06-09 NOTE — Telephone Encounter (Signed)
Patient called stating that since taking Eliquis he is having episodes of light headness (states having to hold onto the wall when he is walking)  Patient states that he is not going to wear the heart monitor. Please call Rueben Bash [phone].

## 2015-06-09 NOTE — Telephone Encounter (Signed)
Spoke with daughter who called to the office on behalf of patient. Daughter advised that no DPR was on file giving [place] permission to speak with her about patient. Daughter stated that she made this request while patient was in hospital. Nurse advised daughter that a DPR would have to be filled out for [facility] specifically when they come for patient's office visit. Per daughter, patient c/o lightheaded on and office since leaving [facility] last Thursday and starting eliquis. Patient felt like the eliquis was causing his lightheadedness and he decided to not take the eliquis. Patient did not take eliquis at all on Saturday and his symptoms resolved. Patient decided to retry eliquis on Sunday night after being persuaded by daughter to take it and his symptoms of lightheadedness returned in addition to sob. Patient is not able to check his BP or HR at home. No c/o chest pain.

## 2015-06-10 ENCOUNTER — Other Ambulatory Visit: Payer: Self-pay

## 2015-06-10 NOTE — Telephone Encounter (Signed)
Symptoms are not common side effects of eliquis, I wonder if it is just coincidental and something else is going on. Please arrange appointment with extender this week. If needed at that appointment can give samples of xarelto to see if tolerates better, but needs to be evaluated first for other causes of his symptoms   Zandra Abts MD

## 2015-06-10 NOTE — Telephone Encounter (Signed)
Patient informed and verbalized understanding of plan. Appointment scheduled with Ermalinda Barrios for tomorrow at 1:40 pm at the [facility] office.

## 2015-06-10 NOTE — Patient Outreach (Signed)
[facility]) Care Management  [place]   06/10/2015  Dylan Williamson 09-05-1934 [id number]  Subjective: Dylan Williamson is a 81 year old male who was referred to me by the hospital liaison due to not having a Part D plan.  Dylan Williamson was recently hospitalized from 06/03/15 to 06/06/15 for newly diagnosed A. Fib.  He was placed on Eliquis.  He cannot afford Eliquis due to the cost and not having a Part D plan.  He did not realize he was supposed to sign up for a Part D plan in 2006.  Unfortunately, he will not be able to sign up for one this year.  He will have to wait until October to sign up for one in 2018.  Today, I am making a home visit to complete the paperwork for Eliquis patient assistance.  Dylan Williamson informed me that he has stopped taking Eliquis due to it making him dizzy.  He has an appointment tomorrow with [facility] to discuss this as well as if there is another option for him.  I reviewed his other medications and reviewed if dizziness was associated with Eliquis.  I did not find any association.  He is not wearing his heart monitor due to not being able to sleep with it on.  He stated he was taking lisinopril but it is not on his medication list.   Objective:  Filed Vitals:   06/10/15 1243  BP: 121/103  Pulse: 73    Encounter Medications: Outpatient Encounter Prescriptions as of 06/10/2015  Medication Sig  . cholecalciferol (VITAMIN D) 1000 units tablet Take 1,000 Units by mouth daily.  Marland Kitchen gabapentin (NEURONTIN) 300 MG capsule Take 300 mg by mouth 2 (two) times daily.  Marland Kitchen glyBURIDE (DIABETA) 5 MG tablet Take 5 mg by mouth 2 (two) times daily with a meal.  . Multiple Vitamins-Minerals (MULTIVITAMIN WITH MINERALS) tablet Take 1 tablet by mouth daily.  . nicotine (NICODERM CQ - DOSED IN MG/24 HOURS) 14 mg/24hr patch Place 14 mg onto the skin daily.  . Omega-3 Fatty Acids (FISH OIL) 1000 MG CAPS Take 1 capsule by mouth 2 (two) times daily.  .  pramipexole (MIRAPEX) 1 MG tablet Take 1 mg by mouth at bedtime.  . traZODone (DESYREL) 150 MG tablet Take by mouth at bedtime.  Marland Kitchen apixaban (ELIQUIS) 5 MG TABS tablet Take 1 tablet (5 mg total) by mouth 2 (two) times daily. (Patient not taking: Reported on 06/10/2015)   No facility-administered encounter medications on file as of 06/10/2015.    Functional Status: In your present state of health, do you have any difficulty performing the following activities: 06/10/2015 06/03/2015  Hearing? - N  Vision? - N  Difficulty concentrating or making decisions? - N  Walking or climbing stairs? - N  Dressing or bathing? - N  Doing errands, shopping? - N  Conservation officer, nature and eating ? N -  Using the Toilet? N -  In the past six months, have you accidently leaked urine? Y -  Do you have problems with loss of bowel control? N -  Managing your Medications? N -  Managing your Finances? N -  Housekeeping or managing your Housekeeping? N -    Fall/Depression Screening: PHQ 2/9 Scores 06/10/2015  PHQ - 2 Score 0    Assessment:  Patient was recently discharged from hospital and all medications have been reviewed.  Drugs sorted by system:  Neurologic/Psychologic: trazodone, gabapentin, pramipexole  Cardiovascular: Eliquis (not taking), lisinopril  Pulmonary/Allergy: none  Gastrointestinal: none  Endocrine: glyburide  Renal: none  Topical: none  Pain: none  Vitamins/Minerals: Vitamin D, Omega 3 fatty acids, multivitamin  Infectious Diseases: none  Miscellaneous: nicotine patches   Duplications in therapy: none Gaps in therapy:  Not on a statin  Medications to avoid in the elderly:  Glyburide (increased risk of hypoglycemia) Drug interactions: none Other issues noted:  Non-adherence to Eliquis   Plan: 1.  I will contact Dylan Williamson after he sees his cardiologist and it is determined what medication he will be placed on for his A.Fib.  I will assist him in applying for patient  assistance from the pharmaceutical company if it is one that he cannot afford.   2.  I did counsel Dylan Williamson on the importance of adhering to his medication.  He stated he is adherent to his other medications.  3.  I will give Dylan Williamson his daughter the number to call for assistance in October with obtaining a Part D plan for 2018.   4.  I will follow up on Thursday, June 8 with a phone call.  I will discuss his diabetes at more length to determine if he is checking his blood sugars as well since Glyburide can cause hypoglycemia.    Deanne Coffer, PharmD, [place] [phone]

## 2015-06-11 ENCOUNTER — Encounter: Payer: Self-pay | Admitting: Physician Assistant

## 2015-06-11 ENCOUNTER — Ambulatory Visit (INDEPENDENT_AMBULATORY_CARE_PROVIDER_SITE_OTHER): Payer: Medicare Other | Admitting: Physician Assistant

## 2015-06-11 ENCOUNTER — Other Ambulatory Visit (HOSPITAL_COMMUNITY)
Admission: RE | Admit: 2015-06-11 | Discharge: 2015-06-11 | Disposition: A | Discharge status: Home / Self Care | Payer: Medicare Other | Source: Ambulatory Visit | Attending: Physician Assistant | Admitting: Physician Assistant

## 2015-06-11 VITALS — BP 130/70 | HR 58 | Ht 74.0 in | Wt 207.4 lb

## 2015-06-11 DIAGNOSIS — I10 Essential (primary) hypertension: Secondary | ICD-10-CM | POA: Diagnosis not present

## 2015-06-11 DIAGNOSIS — I481 Persistent atrial fibrillation: Secondary | ICD-10-CM

## 2015-06-11 DIAGNOSIS — I4819 Other persistent atrial fibrillation: Secondary | ICD-10-CM

## 2015-06-11 DIAGNOSIS — R55 Syncope and collapse: Secondary | ICD-10-CM

## 2015-06-11 DIAGNOSIS — I4891 Unspecified atrial fibrillation: Secondary | ICD-10-CM | POA: Insufficient documentation

## 2015-06-11 DIAGNOSIS — N289 Disorder of kidney and ureter, unspecified: Secondary | ICD-10-CM | POA: Diagnosis not present

## 2015-06-11 LAB — BASIC METABOLIC PANEL
Anion gap: 7 (ref 5–15)
BUN: 24 mg/dL — AB (ref 6–20)
CO2: 23 mmol/L (ref 22–32)
Calcium: 9.2 mg/dL (ref 8.9–10.3)
Chloride: 105 mmol/L (ref 101–111)
Creatinine, Ser: 2.27 mg/dL — ABNORMAL HIGH (ref 0.61–1.24)
GFR calc Af Amer: 30 mL/min — ABNORMAL LOW (ref >60)
GFR, EST NON AFRICAN AMERICAN: 26 mL/min — AB (ref >60)
GLUCOSE: 159 mg/dL — AB (ref 65–99)
POTASSIUM: 4.6 mmol/L (ref 3.5–5.1)
Sodium: 135 mmol/L (ref 135–145)

## 2015-06-11 MED ORDER — RIVAROXABAN 20 MG PO TABS: 20.0000 mg | ORAL_TABLET | Freq: Every day | ORAL | Status: DC

## 2015-06-11 NOTE — Patient Instructions (Signed)
Your physician recommends that you schedule a follow-up appointment with Dr. Harl Bowie   Your physician has recommended you make the following change in your medication:   Start Xarelto 20 mg Daily.   If you need a refill on your cardiac medications before your next appointment, please call your pharmacy.  Thank you for choosing [facility]!

## 2015-06-11 NOTE — Progress Notes (Signed)
Cardiology Office Note    Date:  06/11/2015   ID:  Ansel Bong, DOB Apr 11, 1934, MRN [id number]  PCP:  Rory Percy, MD  Cardiologist:  Dr. Harl Bowie   Chief complaint dizziness   History of Present Illness:  Dylan Williamson is a 81 y.o. male patient who was in the hospital after syncopal episode. He was found to be in atrial fibrillation with right bundle branch block and left anterior fascicular block. He had normal rates not on any AV nodal agents.Because of evidence of underlying conduction disease nothing was started. CHADSVASC=4. He had some bradycardia at night with up to 2 second pauses and at times what looks like ventricular escape rhythm. He will need an outpatient sleep study as well. Head CT was negative for acute abnormality. It was unclear if this was etiology presyncope but the rest of the workup was negative. He was placed on Eliquis for stroke prevention and discharged with a 30 day event monitor. He also has diabetes mellitus, hypertension, CKD with a creatinine of 3.2 on admission down to 2.59 at discharge. 2-D echo showed mild LVH normal systolic function EF 0000000 stay was not technically sufficient to allow evaluation of diastolic function.  Patient comes in today stating that he stopped his Eliquis  because it caused severe dizziness. He feels so much better since he stopped it. No further dizziness. He also is not wearing the monitor because it was falling off at night. He has chronic leg edema. He says he didn't feel good this morning because he added to much salt to the soup his daughter made without  Salt. He thinks he feels bad because he had a stop smoking.    Past Medical History  Diagnosis Date  . Neuropathy ([place])   . Diabetes mellitus without complication ([place])     No past surgical history on file.  Current Medications: Outpatient Prescriptions Prior to Visit  Medication Sig Dispense Refill  . cholecalciferol (VITAMIN D) 1000 units tablet Take  1,000 Units by mouth daily.    Marland Kitchen gabapentin (NEURONTIN) 300 MG capsule Take 300 mg by mouth 2 (two) times daily.    Marland Kitchen glyBURIDE (DIABETA) 5 MG tablet Take 5 mg by mouth 2 (two) times daily with a meal.    . Multiple Vitamins-Minerals (MULTIVITAMIN WITH MINERALS) tablet Take 1 tablet by mouth daily.    . nicotine (NICODERM CQ - DOSED IN MG/24 HOURS) 14 mg/24hr patch Place 14 mg onto the skin daily.    . Omega-3 Fatty Acids (FISH OIL) 1000 MG CAPS Take 1 capsule by mouth 2 (two) times daily.    . pramipexole (MIRAPEX) 1 MG tablet Take 1 mg by mouth at bedtime.    . traZODone (DESYREL) 150 MG tablet Take by mouth at bedtime.    Marland Kitchen apixaban (ELIQUIS) 5 MG TABS tablet Take 1 tablet (5 mg total) by mouth 2 (two) times daily. (Patient not taking: Reported on 06/10/2015) 60 tablet 2   No facility-administered medications prior to visit.     Allergies:   Review of patient's allergies indicates no known allergies.   Social History   Social History  . Marital Status: Widowed    Spouse Name: N/A  . Number of Children: N/A  . Years of Education: N/A   Social History Main Topics  . Smoking status: Current Every Day Smoker -- 1.00 packs/day    Types: Cigarettes  . Smokeless tobacco: None  . Alcohol Use: 0.0 oz/week    0 Standard drinks or  equivalent per week     Comment: occ  . Drug Use: No  . Sexual Activity: Not Asked   Other Topics Concern  . None   Social History Narrative     Family History:  The patient's     ROS:   Please see the history of present illness.    Review of Systems  Constitution: Positive for weakness.  HENT: Negative.   Cardiovascular: Positive for dyspnea on exertion and leg swelling.  Respiratory: Positive for wheezing.   Endocrine: Negative.   Hematologic/Lymphatic: Negative.   Musculoskeletal: Negative.   Gastrointestinal: Negative.   Genitourinary: Negative.   Neurological: Positive for dizziness.   All other systems reviewed and are  negative.   PHYSICAL EXAM:   VS:  BP 130/70 mmHg  Pulse 58  Ht 6\' 2"  (1.88 m)  Wt 207 lb 6.4 oz (94.076 kg)  BMI 26.62 kg/m2  SpO2 92%   GEN: Well nourished, well developed, in no acute distress Neck: no JVD, carotid bruits, or masses Cardiac: Irregular irregular; no murmurs, rubs, or gallops, +1 ankle edema bilaterally Respiratory: Decreased breath sounds with inspiratory wheezing on the right side  GI: soft, nontender, nondistended, + BS MS: no deformity or atrophy Skin: warm and dry, no rash Neuro:  Alert and Oriented x 3, Strength and sensation are intact Psych: euthymic mood, full affect  Wt Readings from Last 3 Encounters:  06/11/15 207 lb 6.4 oz (94.076 kg)  06/05/15 210 lb 1.6 oz (95.301 kg)      Studies/Labs Reviewed:   EKG:  EKG is  ordered today.  The ekg ordered today demonstrates Atrial fibrillation right bundle branch block with PVCs   Recent Labs: 06/03/2015: ALT 19; B Natriuretic Peptide 141.0*; Hemoglobin 9.7*; Magnesium 2.1; Platelets 221; TSH 3.282 06/05/2015: BUN 39*; Creatinine, Ser 2.59*; Potassium 4.8; Sodium 136   Lipid Panel No results found for: CHOL, TRIG, HDL, CHOLHDL, VLDL, LDLCALC, LDLDIRECT  Additional studies/ records that were reviewed today include:  2-D echo 06/05/15 Study Conclusions   - Left ventricle: The cavity size was normal. Wall thickness was   increased in a pattern of mild LVH. Systolic function was normal.   The estimated ejection fraction was in the range of 55% to 60%.   Wall motion was normal; there were no regional wall motion   abnormalities. The study is not technically sufficient to allow   evaluation of LV diastolic function. - Aortic valve: Moderately calcified annulus. Trileaflet. - Mitral valve: Moderately calcified annulus. There was trivial   regurgitation. - Right atrium: Central venous pressure (est): 15 mm Hg. - Tricuspid valve: There was physiologic regurgitation. - Pulmonary arteries: Systolic pressure  could not be accurately   estimated. - Pericardium, extracardiac: A small to moderate pericardial   effusion was identified anterior to the heart and at the apex.   There is evidence of organization suggesting some chronicity. No   obvious RV compromise or definitive tamponade.   Impressions:   - Mild LVH with LVEF 55-60%. Indeterminate diastolic function in   the setting of atrial fibrillation. Moderate MAC with trivial   mitral regurgitation. Sclerotic aortic valve without stenosis.   Elevated CVP, unable to estimate PASP. A small to moderate   pericardial effusion was identified anterior to the heart and at   the apex. There is evidence of organization suggesting some   chronicity. No obvious RV compromise or definitive tamponade.       ASSESSMENT:    1. Persistent atrial fibrillation ([place])  2. Essential hypertension   3. Kidney disease   4. Syncope, unspecified syncope type      PLAN:  In order of problems listed above:  Atrial fibrillation with slow rates. Patient is not on any AV nodal blocking agents. Encouraged him to put the 30 day monitor back on so we know if he is having any bradycardia arrhythmias. The patient did have syncope prompting hospitalization and was having significant dizziness which she says improved after stopping Eliquis.CHADSVASC=4. We'll give samples of Xarelto 20 mg daily. Follow-up with Dr. Harl Bowie in 2 weeks.  Essential hypertension controlled  CKD follow-up renal function today  Syncope no recurrence but was having dizziness he claims was due to the Eliquis. Encouraged him to wear the 30 day monitor.    Medication Adjustments/Labs and Tests Ordered: Current medicines are reviewed at length with the patient today.  Concerns regarding medicines are outlined above.  Medication changes, Labs and Tests ordered today are listed in the Patient Instructions below. There are no Patient Instructions on file for this visit.   Signed, Ermalinda Barrios, PA-C  06/11/2015 2:26 PM    [facility] [place], [place], [place]  [address] Phone: [phone]; Fax: [phone]

## 2015-06-12 ENCOUNTER — Other Ambulatory Visit: Payer: Self-pay

## 2015-06-13 ENCOUNTER — Other Ambulatory Visit: Payer: Self-pay | Admitting: *Deleted

## 2015-06-13 NOTE — Patient Outreach (Signed)
06/13/15- Telephone call to pt for transition of care week 2, spoke with pt, HIPAA verified, pt reports he is taking medications as prescribed and [facility] pharmacist has reached out to him for assistance.  Pt reports no new concerns or problems.  THN CM Care Plan Problem One        Most Recent Value   Care Plan Problem One  Pt high risk for hospital readmission related to disease processes (Atrial Fibrillation/ syncope)   Role Documenting the Problem One  Care Management [place] for Problem One  Active   THN Long Term Goal (31-90 days)  pt will verbalize better understanding of disease processes and have no hospital readmissions within 90 days   THN Long Term Goal Start Date  06/06/15   Interventions for Problem One Long Term Goal  RN CM reviewed calling MD early for change in health status, symptoms   THN CM Short Term Goal #1 (0-30 days)  pt will attend all upcoming MD appointments for month of June within 30 days.   THN CM Short Term Goal #1 Start Date  06/06/15   Interventions for Short Term Goal #1  RN CM reviewed and reinforced all MD appointments for June and importance of keeping all appointments, ask pt to take medications to MD visits   THN CM Short Term Goal #2 (0-30 days)  pt will verbalize medicaitons for atrial fibrillation within 30 days   THN CM Short Term Goal #2 Start Date  06/06/15   Interventions for Short Term Goal #2  RN CM reviewed medications, pt reports he is taking as prescribed      PLAN Continue weekly transition of care calls See pt for home visit 06/23/15  Jacqlyn Larsen [facility], [place] Coordinator [phone]

## 2015-06-13 NOTE — Patient Outreach (Signed)
I called Dylan Williamson to follow up on his visit with his cardiologist.  He stated he was placed on Xarelto 20 mg daily.  He stated he was given 7 boxes with 7 tablets in each.  He stated he had taken only one dose so far.  He stated he tolerated it well.  He did not complain of any side effects.  I stated that I would call him back in four weeks to make sure he was still tolerating the Xarelto.  If he is still tolerating it, I will work on getting it from [employer] since he does not have Part D insurance.  He stated this would be fine.  I will call him the week of July 07, 2015.    Deanne Coffer, PharmD, [employer] [phone]

## 2015-06-16 ENCOUNTER — Encounter: Payer: Medicare Other | Admitting: Cardiology

## 2015-06-17 ENCOUNTER — Emergency Department (HOSPITAL_COMMUNITY): Payer: Medicare Other

## 2015-06-17 ENCOUNTER — Inpatient Hospital Stay (HOSPITAL_COMMUNITY)
Admission: EM | Admit: 2015-06-17 | Discharge: 2015-06-20 | DRG: 291 | Disposition: A | Discharge status: Home Health | Payer: Medicare Other | Attending: Internal Medicine | Admitting: Internal Medicine

## 2015-06-17 ENCOUNTER — Encounter (HOSPITAL_COMMUNITY): Payer: Self-pay | Admitting: Emergency Medicine

## 2015-06-17 DIAGNOSIS — Z87891 Personal history of nicotine dependence: Secondary | ICD-10-CM

## 2015-06-17 DIAGNOSIS — Z7984 Long term (current) use of oral hypoglycemic drugs: Secondary | ICD-10-CM

## 2015-06-17 DIAGNOSIS — I481 Persistent atrial fibrillation: Secondary | ICD-10-CM | POA: Diagnosis present

## 2015-06-17 DIAGNOSIS — A419 Sepsis, unspecified organism: Secondary | ICD-10-CM | POA: Diagnosis present

## 2015-06-17 DIAGNOSIS — N179 Acute kidney failure, unspecified: Secondary | ICD-10-CM | POA: Diagnosis present

## 2015-06-17 DIAGNOSIS — E1140 Type 2 diabetes mellitus with diabetic neuropathy, unspecified: Secondary | ICD-10-CM | POA: Diagnosis present

## 2015-06-17 DIAGNOSIS — I272 Other secondary pulmonary hypertension: Secondary | ICD-10-CM | POA: Diagnosis present

## 2015-06-17 DIAGNOSIS — J810 Acute pulmonary edema: Secondary | ICD-10-CM | POA: Diagnosis not present

## 2015-06-17 DIAGNOSIS — R0600 Dyspnea, unspecified: Secondary | ICD-10-CM | POA: Diagnosis not present

## 2015-06-17 DIAGNOSIS — I110 Hypertensive heart disease with heart failure: Secondary | ICD-10-CM | POA: Diagnosis not present

## 2015-06-17 DIAGNOSIS — Z7901 Long term (current) use of anticoagulants: Secondary | ICD-10-CM

## 2015-06-17 DIAGNOSIS — J189 Pneumonia, unspecified organism: Secondary | ICD-10-CM | POA: Diagnosis present

## 2015-06-17 DIAGNOSIS — T380X5A Adverse effect of glucocorticoids and synthetic analogues, initial encounter: Secondary | ICD-10-CM | POA: Diagnosis present

## 2015-06-17 DIAGNOSIS — E1122 Type 2 diabetes mellitus with diabetic chronic kidney disease: Secondary | ICD-10-CM | POA: Diagnosis present

## 2015-06-17 DIAGNOSIS — E119 Type 2 diabetes mellitus without complications: Secondary | ICD-10-CM

## 2015-06-17 DIAGNOSIS — N189 Chronic kidney disease, unspecified: Secondary | ICD-10-CM

## 2015-06-17 DIAGNOSIS — I130 Hypertensive heart and chronic kidney disease with heart failure and stage 1 through stage 4 chronic kidney disease, or unspecified chronic kidney disease: Secondary | ICD-10-CM | POA: Diagnosis not present

## 2015-06-17 DIAGNOSIS — I4891 Unspecified atrial fibrillation: Secondary | ICD-10-CM | POA: Diagnosis present

## 2015-06-17 DIAGNOSIS — N184 Chronic kidney disease, stage 4 (severe): Secondary | ICD-10-CM | POA: Diagnosis present

## 2015-06-17 DIAGNOSIS — I5031 Acute diastolic (congestive) heart failure: Secondary | ICD-10-CM | POA: Diagnosis not present

## 2015-06-17 DIAGNOSIS — I509 Heart failure, unspecified: Secondary | ICD-10-CM

## 2015-06-17 DIAGNOSIS — I129 Hypertensive chronic kidney disease with stage 1 through stage 4 chronic kidney disease, or unspecified chronic kidney disease: Secondary | ICD-10-CM | POA: Diagnosis not present

## 2015-06-17 DIAGNOSIS — R079 Chest pain, unspecified: Secondary | ICD-10-CM | POA: Diagnosis not present

## 2015-06-17 DIAGNOSIS — Y95 Nosocomial condition: Secondary | ICD-10-CM | POA: Diagnosis present

## 2015-06-17 DIAGNOSIS — R0602 Shortness of breath: Secondary | ICD-10-CM | POA: Diagnosis not present

## 2015-06-17 DIAGNOSIS — J9601 Acute respiratory failure with hypoxia: Secondary | ICD-10-CM | POA: Diagnosis present

## 2015-06-17 HISTORY — DX: Essential (primary) hypertension: I10

## 2015-06-17 HISTORY — DX: Acute kidney failure, unspecified: N17.9

## 2015-06-17 LAB — STREP PNEUMONIAE URINARY ANTIGEN: Strep Pneumo Urinary Antigen: NEGATIVE

## 2015-06-17 LAB — CBC WITH DIFFERENTIAL/PLATELET
BASOS ABS: 0 10*3/uL (ref 0.0–0.1)
BASOS PCT: 0 %
EOS PCT: 2 %
Eosinophils Absolute: 0.1 10*3/uL (ref 0.0–0.7)
HCT: 34.4 % — ABNORMAL LOW (ref 39.0–52.0)
Hemoglobin: 10.9 g/dL — ABNORMAL LOW (ref 13.0–17.0)
LYMPHS PCT: 11 %
Lymphs Abs: 0.8 10*3/uL (ref 0.7–4.0)
MCH: 28.2 pg (ref 26.0–34.0)
MCHC: 31.7 g/dL (ref 30.0–36.0)
MCV: 89.1 fL (ref 78.0–100.0)
MONO ABS: 0.5 10*3/uL (ref 0.1–1.0)
Monocytes Relative: 7 %
Neutro Abs: 6 10*3/uL (ref 1.7–7.7)
Neutrophils Relative %: 80 %
PLATELETS: 267 10*3/uL (ref 150–400)
RBC: 3.86 MIL/uL — AB (ref 4.22–5.81)
RDW: 14.9 % (ref 11.5–15.5)
WBC: 7.5 10*3/uL (ref 4.0–10.5)

## 2015-06-17 LAB — BASIC METABOLIC PANEL
ANION GAP: 10 (ref 5–15)
BUN: 24 mg/dL — ABNORMAL HIGH (ref 6–20)
CALCIUM: 9.1 mg/dL (ref 8.9–10.3)
CO2: 22 mmol/L (ref 22–32)
Chloride: 102 mmol/L (ref 101–111)
Creatinine, Ser: 2.06 mg/dL — ABNORMAL HIGH (ref 0.61–1.24)
GFR, EST AFRICAN AMERICAN: 34 mL/min — AB (ref >60)
GFR, EST NON AFRICAN AMERICAN: 29 mL/min — AB (ref >60)
GLUCOSE: 147 mg/dL — AB (ref 65–99)
POTASSIUM: 4.3 mmol/L (ref 3.5–5.1)
Sodium: 134 mmol/L — ABNORMAL LOW (ref 135–145)

## 2015-06-17 LAB — PROTIME-INR
INR: 2.65 — ABNORMAL HIGH (ref 0.00–1.49)
PROTHROMBIN TIME: 27.9 s — AB (ref 11.6–15.2)

## 2015-06-17 LAB — BRAIN NATRIURETIC PEPTIDE: B Natriuretic Peptide: 290 pg/mL — ABNORMAL HIGH (ref 0.0–100.0)

## 2015-06-17 LAB — APTT: APTT: 46 s — AB (ref 24–37)

## 2015-06-17 LAB — TROPONIN I
Troponin I: <0.03 ng/mL (ref <0.031)
Troponin I: 0.03 ng/mL (ref <0.031)

## 2015-06-17 LAB — MRSA PCR SCREENING: MRSA BY PCR: NEGATIVE

## 2015-06-17 LAB — I-STAT TROPONIN, ED: TROPONIN I, POC: 0 ng/mL (ref 0.00–0.08)

## 2015-06-17 LAB — I-STAT CG4 LACTIC ACID, ED: LACTIC ACID, VENOUS: 1.34 mmol/L (ref 0.5–2.0)

## 2015-06-17 MED ORDER — DEXTROSE 5 % IV SOLN
2.0000 g | INTRAVENOUS | Status: DC
  Administered 2015-06-18 – 2015-06-19 (×2): 2 g via INTRAVENOUS
  Filled 2015-06-17 (×8): qty 2

## 2015-06-17 MED ORDER — ALBUTEROL SULFATE (2.5 MG/3ML) 0.083% IN NEBU
5.0000 mg | INHALATION_SOLUTION | Freq: Once | RESPIRATORY_TRACT | Status: AC
  Administered 2015-06-17: 2.5 mg via RESPIRATORY_TRACT
  Filled 2015-06-17: qty 6

## 2015-06-17 MED ORDER — IPRATROPIUM-ALBUTEROL 0.5-2.5 (3) MG/3ML IN SOLN
3.0000 mL | Freq: Once | RESPIRATORY_TRACT | Status: AC
  Administered 2015-06-17: 3 mL via RESPIRATORY_TRACT
  Filled 2015-06-17: qty 3

## 2015-06-17 MED ORDER — VANCOMYCIN HCL 10 G IV SOLR
1500.0000 mg | Freq: Once | INTRAVENOUS | Status: AC
  Administered 2015-06-17: 1500 mg via INTRAVENOUS
  Filled 2015-06-17: qty 1500

## 2015-06-17 MED ORDER — SODIUM CHLORIDE 0.9 % IV SOLN: 250.0000 mL | INTRAVENOUS | Status: DC | PRN

## 2015-06-17 MED ORDER — SODIUM CHLORIDE 0.9% FLUSH: 3.0000 mL | INTRAVENOUS | Status: DC | PRN

## 2015-06-17 MED ORDER — SODIUM CHLORIDE 0.9% FLUSH
3.0000 mL | Freq: Two times a day (BID) | INTRAVENOUS | Status: DC
  Administered 2015-06-17 – 2015-06-20 (×7): 3 mL via INTRAVENOUS

## 2015-06-17 MED ORDER — DEXTROSE 5 % IV SOLN
2.0000 g | Freq: Once | INTRAVENOUS | Status: AC
  Administered 2015-06-17: 2 g via INTRAVENOUS

## 2015-06-17 MED ORDER — PRAMIPEXOLE DIHYDROCHLORIDE 1 MG PO TABS
1.0000 mg | ORAL_TABLET | Freq: Every day | ORAL | Status: DC
  Administered 2015-06-17 – 2015-06-19 (×3): 1 mg via ORAL
  Filled 2015-06-17 (×3): qty 1

## 2015-06-17 MED ORDER — GABAPENTIN 300 MG PO CAPS
300.0000 mg | ORAL_CAPSULE | Freq: Two times a day (BID) | ORAL | Status: DC
  Administered 2015-06-17 – 2015-06-20 (×7): 300 mg via ORAL
  Filled 2015-06-17 (×7): qty 1

## 2015-06-17 MED ORDER — SODIUM CHLORIDE 0.9% FLUSH
3.0000 mL | Freq: Two times a day (BID) | INTRAVENOUS | Status: DC
  Administered 2015-06-17 – 2015-06-19 (×4): 3 mL via INTRAVENOUS

## 2015-06-17 MED ORDER — ALBUTEROL SULFATE (2.5 MG/3ML) 0.083% IN NEBU: 2.5000 mg | INHALATION_SOLUTION | RESPIRATORY_TRACT | Status: DC | PRN

## 2015-06-17 MED ORDER — RIVAROXABAN 20 MG PO TABS
20.0000 mg | ORAL_TABLET | Freq: Every day | ORAL | Status: DC
  Administered 2015-06-17: 20 mg via ORAL
  Filled 2015-06-17: qty 1

## 2015-06-17 MED ORDER — ASPIRIN EC 81 MG PO TBEC
81.0000 mg | DELAYED_RELEASE_TABLET | Freq: Every day | ORAL | Status: DC
  Administered 2015-06-17 – 2015-06-20 (×4): 81 mg via ORAL
  Filled 2015-06-17 (×4): qty 1

## 2015-06-17 MED ORDER — ACETAMINOPHEN 650 MG RE SUPP: 650.0000 mg | Freq: Four times a day (QID) | RECTAL | Status: DC | PRN

## 2015-06-17 MED ORDER — TRAZODONE HCL 50 MG PO TABS
150.0000 mg | ORAL_TABLET | Freq: Every day | ORAL | Status: DC
  Administered 2015-06-17 – 2015-06-19 (×3): 150 mg via ORAL
  Filled 2015-06-17 (×3): qty 3

## 2015-06-17 MED ORDER — FUROSEMIDE 10 MG/ML IJ SOLN
40.0000 mg | Freq: Two times a day (BID) | INTRAMUSCULAR | Status: DC
  Administered 2015-06-17 – 2015-06-20 (×5): 40 mg via INTRAVENOUS
  Filled 2015-06-17 (×7): qty 4

## 2015-06-17 MED ORDER — NICOTINE 14 MG/24HR TD PT24
14.0000 mg | MEDICATED_PATCH | Freq: Every day | TRANSDERMAL | Status: DC
  Administered 2015-06-17 – 2015-06-20 (×4): 14 mg via TRANSDERMAL
  Filled 2015-06-17 (×4): qty 1

## 2015-06-17 MED ORDER — METHYLPREDNISOLONE SODIUM SUCC 40 MG IJ SOLR
40.0000 mg | Freq: Every day | INTRAMUSCULAR | Status: DC
  Administered 2015-06-17 – 2015-06-20 (×4): 40 mg via INTRAVENOUS
  Filled 2015-06-17 (×4): qty 1

## 2015-06-17 MED ORDER — FUROSEMIDE 10 MG/ML IJ SOLN
20.0000 mg | Freq: Once | INTRAMUSCULAR | Status: AC
  Administered 2015-06-17: 20 mg via INTRAVENOUS
  Filled 2015-06-17: qty 2

## 2015-06-17 MED ORDER — SODIUM CHLORIDE 0.9 % IV SOLN
1250.0000 mg | INTRAVENOUS | Status: DC
  Filled 2015-06-17 (×2): qty 1250

## 2015-06-17 MED ORDER — ENOXAPARIN SODIUM 40 MG/0.4ML ~~LOC~~ SOLN
40.0000 mg | SUBCUTANEOUS | Status: DC
  Administered 2015-06-17: 40 mg via SUBCUTANEOUS
  Filled 2015-06-17: qty 0.4

## 2015-06-17 MED ORDER — ASPIRIN 81 MG PO CHEW
324.0000 mg | CHEWABLE_TABLET | Freq: Once | ORAL | Status: AC
  Administered 2015-06-17: 324 mg via ORAL
  Filled 2015-06-17: qty 4

## 2015-06-17 MED ORDER — ACETAMINOPHEN 325 MG PO TABS: 650.0000 mg | ORAL_TABLET | Freq: Four times a day (QID) | ORAL | Status: DC | PRN

## 2015-06-17 NOTE — ED Notes (Signed)
Called Respiratory for treatment.

## 2015-06-17 NOTE — Progress Notes (Addendum)
Pharmacy Antibiotic Note  Dylan Williamson is a 81 y.o. male admitted on 06/17/2015 with pneumonia.  Pharmacy has been consulted for cefepime and vancomycin dosing. Vancomycin 1500 mg given in ED  Plan: Cont vanc 1250 mg IV q24 hours Cefepime 2 gm IV q24 hours F/u renal function, cultures and clinical course  Height: 6\' 3"  (190.5 cm) Weight: 200 lb (90.719 kg) IBW/kg (Calculated) : 84.5  Temp (24hrs), Avg:97.6 F (36.4 C), Min:97.6 F (36.4 C), Max:97.6 F (36.4 C)   Recent Labs Lab 06/11/15 1457 06/17/15 0945  WBC  --  7.5  CREATININE 2.27* 2.06*    Estimated Creatinine Clearance: 35.3 mL/min (by C-G formula based on Cr of 2.06).    No Known Allergies  Antimicrobials this admission: 6/13 cefepime >>  6/13  Vanc>>  Thank you for allowing pharmacy to be a part of this patient's care.  Beverlee Nims 06/17/2015 12:31 PM

## 2015-06-17 NOTE — ED Notes (Addendum)
Pt reports cough and shortness of breath since Saturday. Pt reports chest tightness began last night. Pt reports recently admitted for same. Pt family reports pt was started on eliquis which was recently switched to [place]. Moderate dyspnea, congested cough noted in triage. Pt 90% on room air, pt placed on 2 liters. 02 saturation now 93%. Auditory wheezing heard in triage.

## 2015-06-17 NOTE — ED Provider Notes (Addendum)
CSN: [id number]     Arrival date & time 06/17/15  0846 History   First MD Initiated Contact with Patient 06/17/15 [phone]     Chief Complaint  Patient presents with  . Shortness of Breath     (Consider location/radiation/quality/duration/timing/severity/associated sxs/prior Treatment) HPI  81 yo man with history of HTn, T2DM, CKD, A-fib with slow rates who presents with chest tightness and shortness of breath with cough since Saturday.  He says that Saturday night, he had an episode of emesis, and abdominal discomfort yesterday, and during that time the dyspnea started and he noticed his breathing got more labored and last night he noticed some chest tightness but no chest pain.  He normally does not use oxygen at home but on arrival he was found to be 89-90% on RA, and was put on 2 L and sats went up to 93%.  He experiences some orthopnea, but uses 1 pillow at night. He also is having cough with clear-white sputum production for last few weeks.  He says he smoked for about 7 years, but has not smoked for about 2 weeks since he was discharged from the hospital .   He denies any fevers, chest pain, palpitations, dizziness or headaches. Has chronic lower extremity edema.   He was recently seen in the ER and admitted for syncope workup and found to have A-fib with slow rates and was started on Eliquis, but switched to Xarelto currently as the eliquis was making him dizzy. He is not on any nodal agents. At that time, BNP was 141, troponin was negative, and TSH was normal. He was sent home with a 30 day event monitor. A 2d echo showed EF of 55-60%. He is supposed to follow up with cardiology.     Past Medical History  Diagnosis Date  . Neuropathy ([place])   . Diabetes mellitus without complication ([place])   . Hypertension   . Acute renal failure ([place])    History reviewed. No pertinent past surgical history. Family History  Problem Relation Age of Onset  . Family history unknown: Yes   Social  History  Substance Use Topics  . Smoking status: Former Smoker -- 1.00 packs/day    Types: Cigarettes    Quit date: 06/04/2015  . Smokeless tobacco: None  . Alcohol Use: 0.0 oz/week    0 Standard drinks or equivalent per week     Comment: occ    Review of Systems  Constitutional: Positive for fatigue. Negative for fever and chills.  Eyes: Negative for photophobia.  Respiratory: Positive for cough, chest tightness, shortness of breath and wheezing.   Cardiovascular: Positive for leg swelling. Negative for chest pain and palpitations.  Gastrointestinal: Negative for nausea, vomiting, abdominal pain, diarrhea and abdominal distention.  Genitourinary: Negative for dysuria.  Skin: Negative for rash.  Neurological: Negative for dizziness, syncope and weakness.  All other systems reviewed and are negative.     Allergies  Review of patient's allergies indicates no known allergies.  Home Medications   Prior to Admission medications   Medication Sig Start Date End Date Taking? Authorizing Provider  cholecalciferol (VITAMIN D) 1000 units tablet Take 1,000 Units by mouth daily.   Yes Historical Provider, MD  gabapentin (NEURONTIN) 300 MG capsule Take 300 mg by mouth 2 (two) times daily.   Yes Historical Provider, MD  glyBURIDE (DIABETA) 5 MG tablet Take 10 mg by mouth 2 (two) times daily with a meal.    Yes Historical Provider, MD  lisinopril (PRINIVIL,ZESTRIL) 40  MG tablet Take 40 mg by mouth daily.   Yes Historical Provider, MD  Multiple Vitamins-Minerals (MULTIVITAMIN WITH MINERALS) tablet Take 1 tablet by mouth daily.   Yes Historical Provider, MD  nicotine (NICODERM CQ - DOSED IN MG/24 HOURS) 14 mg/24hr patch Place 14 mg onto the skin daily.   Yes Historical Provider, MD  Omega-3 Fatty Acids (FISH OIL) 1000 MG CAPS Take 1 capsule by mouth 2 (two) times daily.   Yes Historical Provider, MD  pramipexole (MIRAPEX) 1 MG tablet Take 1 mg by mouth at bedtime.   Yes Historical Provider, MD   rivaroxaban (XARELTO) 20 MG TABS tablet Take 1 tablet (20 mg total) by mouth daily with supper. 06/11/15  Yes Imogene Burn, PA-C  traZODone (DESYREL) 150 MG tablet Take 150 mg by mouth at bedtime.    Yes Historical Provider, MD   BP 121/72 mmHg  Pulse 93  Temp(Src) 97.6 F (36.4 C) (Oral)  Resp 37  Ht 6\' 3"  (1.905 m)  Wt 200 lb (90.719 kg)  BMI 25.00 kg/m2  SpO2 91% Physical Exam  Constitutional: He is oriented to person, place, and time. He appears well-developed.  HENT:  Head: Normocephalic and atraumatic.  Eyes: Conjunctivae are normal.  Neck: Neck supple. No tracheal deviation present.  Cardiovascular: Intact distal pulses.   No murmur heard. Heart rate in the 90s- regular  Pulmonary/Chest: He has wheezes. He has rales.  End expiratory crackles, and some wheezing   Abdominal: Soft. Bowel sounds are normal. He exhibits no distension. There is no tenderness.  Musculoskeletal: He exhibits edema (mild LE bilteral).  2+ pitting edema bilaterally to the knee  Neurological: He is alert and oriented to person, place, and time.  Skin: Skin is warm and dry. No erythema.  Psychiatric:  resp difficulty  Nursing note and vitals reviewed.   ED Course  Procedures (including critical care time) CRITICAL CARE Performed by: Mariea Clonts   Total critical care time: 40 minutes  Critical care time was exclusive of separately billable procedures and treating other patients.  Critical care was necessary to treat or prevent imminent or life-threatening deterioration.  Critical care was time spent personally by me on the following activities: development of treatment plan with patient and/or surrogate as well as nursing, discussions with consultants, evaluation of patient's response to treatment, examination of patient, obtaining history from patient or surrogate, ordering and performing treatments and interventions, ordering and review of laboratory studies, ordering and review of  radiographic studies, pulse oximetry and re-evaluation of patient's condition.  Labs Review Labs Reviewed  BASIC METABOLIC PANEL - Abnormal; Notable for the following:    Sodium 134 (*)    Glucose, Bld 147 (*)    BUN 24 (*)    Creatinine, Ser 2.06 (*)    GFR calc non Af Amer 29 (*)    GFR calc Af Amer 34 (*)    All other components within normal limits  BRAIN NATRIURETIC PEPTIDE - Abnormal; Notable for the following:    B Natriuretic Peptide 290.0 (*)    All other components within normal limits  CBC WITH DIFFERENTIAL/PLATELET - Abnormal; Notable for the following:    RBC 3.86 (*)    Hemoglobin 10.9 (*)    HCT 34.4 (*)    All other components within normal limits  CULTURE, BLOOD (ROUTINE X 2)  CULTURE, BLOOD (ROUTINE X 2)  I-STAT TROPOININ, ED  I-STAT CG4 LACTIC ACID, ED    Imaging Review Dg Chest 2 View  06/17/2015  CLINICAL DATA:  Shortness of breath and chest pain for approximately 2 weeks EXAM: CHEST  2 VIEW COMPARISON:  Jun 03, 2015 FINDINGS: There is airspace consolidation in the right base. There are small pleural effusions bilaterally. There is atelectatic change in the left base. There is mild underlying interstitial edema. Heart is mildly enlarged with mild pulmonary venous hypertension. No adenopathy evident. No bone lesions. No pneumothorax. IMPRESSION: Evidence of a degree of congestive heart failure. Suspect superimposed pneumonia right base. Mild atelectasis noted in left base. Electronically Signed   By: Lowella Grip III M.D.   On: 06/17/2015 10:01   I have personally reviewed and evaluated these images and lab results as part of my medical decision-making.   EKG Interpretation   Date/Time:  Tuesday June 17 2015 08:55:38 EDT Ventricular Rate:  115 PR Interval:    QRS Duration: 145 QT Interval:  359 QTC Calculation: 497 R Axis:   118 Text Interpretation:  Atrial flutter Paired ventricular premature  complexes Right bundle branch block Abnormal lateral Q  waves Baseline  wander in lead(s) V3 Confirmed by Kemiya Batdorf MD, Laquita Harlan [id number]) on 06/17/2015  11:32:21 AM     EKG reviewed heart rate 1:15, poor baseline, concern for atrial fib/flutter, normal QT  Repeat EKG due to worsening shortness of breath. Poor baseline concern for sinus tachycardia with PVCs, prolonged QT, elevated aVR and V1 with mild ST depression in other leads concerning for global ischemia. MDM   Final diagnoses:  Congestive heart failure, unspecified congestive heart failure chronicity, unspecified congestive heart failure type (HCC)  Acute dyspnea  CRF (chronic renal failure), unspecified stage  HCAP  81 yo man who presents with 2 day history of shortness of breath and 1 day history fo chest tightness, who was recently found to have A-fib with slow rates currently on an event monitor, and on Eliquis but not on nodal agent. On last admission, found to have BNP of 241 but normal troponin.Exam reveals end expiratory crackles and some wheezing. Ordered CBC, BMET, BNP , CXR, and i stat troponin Given one time Duoneb  Differentials include CHF exacerbation vs HCAP, ACS, airspace disease (COPD? But no PFTs on file) . Unlikely to be ACS as I-stat troponin is 0.  Most likely CHF exacerbation. His recently diagnosed atrial fibrillation could be driving his CHF. He is lasix-naive and has never had an exacerbation in the past, but he was just recently diagnosed with A-fib and is not on a nodal agent. Exam shows some crackles bilaterally and CXR shows some degree of CHF. His symptoms are highly suggestive of CHF along with the 2+ pitting edema.  His BNP is elevated at 290 from 141 2 weeks ago.   Given that he was recently admitted, and CXR showing some consolidation in the right lower lobe, we can not rule out HCAP, though hehas been afebrile and vitals normal, but in a 81 yo M, we have low threshold for starting antibiotics. Vanc and cefepime would be the agents of choice.  10:36: Given one  time 20 mg IV lasix. And will consult to hospitalist for admission  11:09- Discussed with triad hospitalist- will admit for obs - Temp orders placed     Burgess Estelle, MD 06/17/15 1110  Medical screening examination/treatment/procedure(s) were conducted as a shared visit with non-physician practitioner(s) or resident  and myself.  I personally evaluated the patient during the encounter and agree with the findings.   I have personally reviewed any xrays and/ or EKG's  with the provider and I agree with interpretation.   Congestive heart failure, unspecified congestive heart failure chronicity, unspecified congestive heart failure type ([place])  Acute dyspnea  CRF (chronic renal failure), unspecified stage   Patient presents with worsening shortness of breath and productive cough. Clinical concern for heart failure versus pneumonia. Chest x-ray concerning for both pathologies. Patient's work of breathing increase in the ER. Plan for BiPAP, Lasix, antibiotics, cultures and admission. Patient has rales and wheezing on exam bilateral, tachypnea. Mild leg swelling lower extremities.  Elnora Morrison, MD 06/17/15 1131  Elnora Morrison, MD 06/17/15 [id number]  Elnora Morrison, MD 06/17/15 [phone]

## 2015-06-17 NOTE — ED Notes (Signed)
SPoke with Dr. Reather Converse regarding Dylan Williamson. Pt with increased WOB, tachypnea and tachycardia

## 2015-06-17 NOTE — H&P (Addendum)
History and Physical  Dylan Williamson [id number] DOB: 1934-01-07 DOA: 06/17/2015  PCP: Rory Percy, MD  Patient coming from: home  Chief Complaint: shortness of breath  HPI:  48 yom with a hx of HTN, DM type 2, CKD stage 3, and afib with slow rates, who was recently admitted at [facility] for evaluation following a syncopal episode. He presents to the ED with complaints of shortness of breath, chest tightness and cough onset Saturday. Initial evaluation revealed right lower lobe pneumonia, acute hypoxic respiratory failure. Placed on BiPAP and admitted.  He lives at home by himself and was in his usual state of health until approximately two weeks ago when he experienced a syncopal episode with associated lightheadedness. At the time, he was found to have cardiomegaly and was started on eliquis, but was transitioned due Xarelto due to complaints of dizziness while on Eliquis. This past Saturday night, he began feeling ill with symptoms including shortness of breath, chest tightness, nausea, and an abnormal feeling in his abdomen. He denies any chest pain. His daughter reports that when she checked on him last night, he was experiencing labored breathing which worsened overnight. She subsequently brought him to the hospital today.   Admits nausea. Has loss of appetite. Reports that his heart has felt as though it beats fast and he cant catch his breath. He has been taking meds as prescribed.  Denies vomiting.  Denies swelling. Denies any CP. Denies abd pain or diarrhea, difficulty urinating, bleeding, rash, new muscle aches. No vision changes or sore throat.    ED Course: afebrile, normal BP, no hypoxia, BiPAP, Lasix Pertinent labs: Creatinine at baseline, 2.06. BNP 290. Troponin 0. Lactic acid within normal limits. CBC unremarkable. EKG: Independently reviewed. Atrial flutter with RVR, RBBB, all 3 EKGs reviewed with cardiology as documented below. Imaging: independently reviewed. CXR showed right  LL infiltrate and effusion.   Review of Systems:  Negative for fever, visual changes, sore throat, rash, new muscle aches, chest pain,  dysuria, bleeding, n/abdominal pain.  Past Medical History  Diagnosis Date  . Neuropathy ([place])   . Diabetes mellitus without complication ([place])   . Hypertension   . Acute renal failure ([place])     History reviewed. No pertinent past surgical history.   reports that he quit smoking about 1 weeks ago. His smoking use included Cigarettes. He smoked 1.00 pack per day. He does not have any smokeless tobacco history on file. He reports that he drinks alcohol. He reports that he does not use illicit drugs. Ambulatory status: able to ambulate by self  No Known Allergies  Family History  Problem Relation Age of Onset  . Family history unknown: Yes     Prior to Admission medications   Medication Sig Start Date End Date Taking? Authorizing Provider  cholecalciferol (VITAMIN D) 1000 units tablet Take 1,000 Units by mouth daily.   Yes Historical Provider, MD  gabapentin (NEURONTIN) 300 MG capsule Take 300 mg by mouth 2 (two) times daily.   Yes Historical Provider, MD  glyBURIDE (DIABETA) 5 MG tablet Take 10 mg by mouth 2 (two) times daily with a meal.    Yes Historical Provider, MD  lisinopril (PRINIVIL,ZESTRIL) 40 MG tablet Take 40 mg by mouth daily.   Yes Historical Provider, MD  Multiple Vitamins-Minerals (MULTIVITAMIN WITH MINERALS) tablet Take 1 tablet by mouth daily.   Yes Historical Provider, MD  nicotine (NICODERM CQ - DOSED IN MG/24 HOURS) 14 mg/24hr patch Place 14 mg onto the skin daily.  Yes Historical Provider, MD  Omega-3 Fatty Acids (FISH OIL) 1000 MG CAPS Take 1 capsule by mouth 2 (two) times daily.   Yes Historical Provider, MD  pramipexole (MIRAPEX) 1 MG tablet Take 1 mg by mouth at bedtime.   Yes Historical Provider, MD  rivaroxaban (XARELTO) 20 MG TABS tablet Take 1 tablet (20 mg total) by mouth daily with supper. 06/11/15  Yes Imogene Burn,  PA-C  traZODone (DESYREL) 150 MG tablet Take 150 mg by mouth at bedtime.    Yes Historical Provider, MD    Physical Exam: Filed Vitals:   06/17/15 1200 06/17/15 1230 06/17/15 1300 06/17/15 1318  BP: 137/103 135/70 126/94   Pulse: 109 112 106   Temp:      TempSrc:      Resp: 33 32 34   Height:      Weight:      SpO2: 96% 96% 97% 97%    Constitutional:  . AppearsIll but not toxic. Currently on BiPAP. Eyes:  . PERRL and irises appear normal . Normal conjunctivae and lids ENMT:  . external ears, nose appear normal . grossly normal hearing . Lips unremarkable, limited view on BiPAP. Respiratory:  . Fair air movement, no frank w/r/r. Able to speak in short sentences. . Moderate increased respiratory effort Cardiovascular:  . RRR, no m/r/g -tachy irregular . 1+  BLE extremity edema   Abdomen:  . Abdomen appears normal; no tenderness or masses . No hernias noted. Musculoskeletal:  . RUE, LUE, RLE, LLE   o strength and tone normal Neurologic:  . Grossly normal Psychiatric:  . judgement and insight appear normal . Mental status o Mood, affect appropriate  Wt Readings from Last 3 Encounters:  06/17/15 90.719 kg (200 lb)  06/11/15 94.076 kg (207 lb 6.4 oz)  06/05/15 95.301 kg (210 lb 1.6 oz)    I have personally reviewed following labs and imaging studies  Labs on Admission:  CBC:  Recent Labs Lab 06/17/15 0945  WBC 7.5  NEUTROABS 6.0  HGB 10.9*  HCT 34.4*  MCV 89.1  PLT 99991111   Basic Metabolic Panel:  Recent Labs Lab 06/11/15 1457 06/17/15 0945  NA 135 134*  K 4.6 4.3  CL 105 102  CO2 23 22  GLUCOSE 159* 147*  BUN 24* 24*  CREATININE 2.27* 2.06*  CALCIUM 9.2 9.1     Radiological Exams on Admission: Dg Chest 2 View  06/17/2015  CLINICAL DATA:  Shortness of breath and chest pain for approximately 2 weeks EXAM: CHEST  2 VIEW COMPARISON:  Jun 03, 2015 FINDINGS: There is airspace consolidation in the right base. There are small pleural effusions  bilaterally. There is atelectatic change in the left base. There is mild underlying interstitial edema. Heart is mildly enlarged with mild pulmonary venous hypertension. No adenopathy evident. No bone lesions. No pneumothorax. IMPRESSION: Evidence of a degree of congestive heart failure. Suspect superimposed pneumonia right base. Mild atelectasis noted in left base. Electronically Signed   By: Lowella Grip III M.D.   On: 06/17/2015 10:01    EKG: Independently reviewed. Atrial flutter, abnormal lateral Q waves. Right BBB  Principal Problem:   Healthcare-associated pneumonia Active Problems:   Atrial fibrillation (HCC)   Acute respiratory failure with hypoxia (HCC)   Acute pulmonary edema (HCC)   DM type 2 (diabetes mellitus, type 2) (HCC)   Assessment/Plan 1. RLL PNA with acute hypoxic respiratory failure. Lactic acid normal, no evidence of end organ dysfunction. 2. Acute hypoxic respiratory failure.  Currently on BiPAP, and appears comfortable.  3. Acute pulmonary edema recent echocardiogram with normal LVEF, no comment on diastolic dysfunction. Suspect related to pneumonia. CXR showed enlargement of heart with mild pulmonary venous hypertension. 4. Persistent atrial fibrillation, RVR with rapid ventricular response. Recent dx, started on Eliquis but switched to Xarelto. Not on any nodal agents. CHADSVASC=4..  5. DM type 2 with neuropathy. Blood sugar stable.  Anion gap 10. 6. CKD stage 3. Monitor in the setting of diuresis. Appears to be a baseline. 7. Tobacco use disorder in remission   Appears to be ill but not toxic, tolerating BiPAP.  Admit to stepdown. Empiric antibiotics, steroids, bronchodilators as needed, BiPAP. No indication for intubation at this point.  IV diuresis  Trend troponin but no chest pain or history to suggest ACS. EKG is reviewed with Dr. Domenic Polite cardiology, no changes suggestive of new ischemia seen.  Continue chronic medications, may require IV  diltiazem infusion for heart rate control.  DVT prophylaxis:Xarelto Code Status: Full Family Communication: Discussed with patient and daughter Disposition Plan: Admit to stepdown. Discharge home once improved  Consults called: none   Admission status: Admit as inpatient.    Time spent: 55 minutes  Murray Hodgkins, MD  [facility] Direct contact: [phone] --[place] [place]  --[email or web address]; password [email or web address]  7PM-7AM contact night coverage as above  06/17/2015, 2:14 PM  By signing my name below, I, Delene Ruffini, attest that this documentation has been prepared under the direction and in the presence of Daniel P. Sarajane Jews, MD. Electronically Signed: Delene Ruffini, Scribe.  06/17/2015 12:15pm  I personally performed the services described in this documentation. All medical record entries made by the scribe were at my direction. I have reviewed the chart and agree that the record reflects my personal performance and is accurate and complete. Murray Hodgkins, MD

## 2015-06-18 DIAGNOSIS — I5031 Acute diastolic (congestive) heart failure: Secondary | ICD-10-CM

## 2015-06-18 DIAGNOSIS — J9601 Acute respiratory failure with hypoxia: Secondary | ICD-10-CM

## 2015-06-18 DIAGNOSIS — J189 Pneumonia, unspecified organism: Secondary | ICD-10-CM

## 2015-06-18 DIAGNOSIS — N184 Chronic kidney disease, stage 4 (severe): Secondary | ICD-10-CM

## 2015-06-18 DIAGNOSIS — I481 Persistent atrial fibrillation: Secondary | ICD-10-CM

## 2015-06-18 LAB — CBC
HEMATOCRIT: 29.3 % — AB (ref 39.0–52.0)
Hemoglobin: 9.5 g/dL — ABNORMAL LOW (ref 13.0–17.0)
MCH: 28.4 pg (ref 26.0–34.0)
MCHC: 32.4 g/dL (ref 30.0–36.0)
MCV: 87.5 fL (ref 78.0–100.0)
PLATELETS: 246 10*3/uL (ref 150–400)
RBC: 3.35 MIL/uL — ABNORMAL LOW (ref 4.22–5.81)
RDW: 15 % (ref 11.5–15.5)
WBC: 5.9 10*3/uL (ref 4.0–10.5)

## 2015-06-18 LAB — BASIC METABOLIC PANEL
Anion gap: 7 (ref 5–15)
BUN: 25 mg/dL — AB (ref 6–20)
CHLORIDE: 102 mmol/L (ref 101–111)
CO2: 26 mmol/L (ref 22–32)
CREATININE: 2.21 mg/dL — AB (ref 0.61–1.24)
Calcium: 8.5 mg/dL — ABNORMAL LOW (ref 8.9–10.3)
GFR calc Af Amer: 31 mL/min — ABNORMAL LOW (ref >60)
GFR calc non Af Amer: 27 mL/min — ABNORMAL LOW (ref >60)
GLUCOSE: 169 mg/dL — AB (ref 65–99)
POTASSIUM: 4.7 mmol/L (ref 3.5–5.1)
Sodium: 135 mmol/L (ref 135–145)

## 2015-06-18 LAB — EXPECTORATED SPUTUM ASSESSMENT W REFEX TO RESP CULTURE

## 2015-06-18 LAB — TROPONIN I: Troponin I: <0.03 ng/mL

## 2015-06-18 LAB — EXPECTORATED SPUTUM ASSESSMENT W GRAM STAIN, RFLX TO RESP C

## 2015-06-18 MED ORDER — IPRATROPIUM BROMIDE 0.02 % IN SOLN
0.5000 mg | Freq: Four times a day (QID) | RESPIRATORY_TRACT | Status: DC
  Administered 2015-06-18 – 2015-06-19 (×6): 0.5 mg via RESPIRATORY_TRACT
  Filled 2015-06-18 (×8): qty 2.5

## 2015-06-18 MED ORDER — LEVALBUTEROL HCL 0.63 MG/3ML IN NEBU
0.6300 mg | INHALATION_SOLUTION | Freq: Four times a day (QID) | RESPIRATORY_TRACT | Status: DC
  Administered 2015-06-18 – 2015-06-19 (×6): 0.63 mg via RESPIRATORY_TRACT
  Filled 2015-06-18 (×8): qty 3

## 2015-06-18 MED ORDER — RIVAROXABAN 15 MG PO TABS
15.0000 mg | ORAL_TABLET | Freq: Every day | ORAL | Status: DC
  Administered 2015-06-18 – 2015-06-19 (×2): 15 mg via ORAL
  Filled 2015-06-18 (×3): qty 1

## 2015-06-18 MED ORDER — DILTIAZEM HCL 30 MG PO TABS
30.0000 mg | ORAL_TABLET | Freq: Two times a day (BID) | ORAL | Status: DC
  Administered 2015-06-18 – 2015-06-20 (×5): 30 mg via ORAL
  Filled 2015-06-18 (×5): qty 1

## 2015-06-18 MED ORDER — GUAIFENESIN ER 600 MG PO TB12
1200.0000 mg | ORAL_TABLET | Freq: Two times a day (BID) | ORAL | Status: DC
  Administered 2015-06-18 – 2015-06-20 (×5): 1200 mg via ORAL
  Filled 2015-06-18 (×5): qty 2

## 2015-06-18 MED ORDER — LEVALBUTEROL HCL 0.63 MG/3ML IN NEBU
0.6300 mg | INHALATION_SOLUTION | RESPIRATORY_TRACT | Status: DC | PRN
Start: 2015-06-18 — End: 2015-06-20

## 2015-06-18 NOTE — Progress Notes (Signed)
md notified of low bp and of hs meds administered. No new orders at this time but will continue to monitor.

## 2015-06-18 NOTE — Progress Notes (Signed)
PROGRESS NOTE    Dylan Williamson  [id number] DOB: 25-Apr-1934 DOA: 06/17/2015 PCP: Rory Percy, MD  Outpatient Specialists:    Brief Narrative:  Patient presented with complaints of chest tightness and was found to have RLL PNA with acute hypoxic respiratory failure. While in the ED, he was started on BiPAP, empiric antibiotics, and admitted to stepdown.  His Vancomycin will be discontinued today and he can likely be transitioned ou of the ICU.   Assessment & Plan:   Principal Problem:   Healthcare-associated pneumonia Active Problems:   Atrial fibrillation ([place])   Acute respiratory failure with hypoxia (HCC)   Acute pulmonary edema (HCC)   DM type 2 (diabetes mellitus, type 2) (HCC)   Acute diastolic CHF (congestive heart failure) (HCC)   CKD (chronic kidney disease) stage 4, GFR 15-29 ml/min ([place])  1. Acute hypoxic respiratory failure, secondary to RLL PNA/ CHF. Patient is not chronically on home O2. Patient's respiratory status has improved greatly and he has been transitioned off of BiPAP and onto [place]. He still has evidence of wheeze, but appears quite comfortable. Wean O2 as tolerated.  2. HCAP. CXR shows RLL PNA. Lactic acid reassuring and no leukocytosis. Strep pneumo antigen negative.  Follow up cultures. Continue steroids, bronchodilators PRN. MRSA PCR is negative, so vancomycin will be discontinued. Continue Cefepime for now.   3. Acute diastolic heart failure. BNP mildly elevated at 290. Recent ECHO shows normal LVEF, but study was not technically sufficient to comment on diastolic function. Patient was started on IV Lasix and was diuresed approximately 3.2 L yesterday. Overall respiratory status appears to be improving. Continue current treatments.  4. Persistent atrial fibrillation with RVR. This is a recent diagnosis; he was initially on Eliquis, but was recently changed to Xarelto due to complaints of dizziness. He is not on any nodal agents. CHADVASC=4. Troponin has  remained negative and he has no complaints of chest pain. Currently he is mildly tachy, will start on low dose oral diltiazem.  5. DM type 2. With neuropathy. Blood sugars stable. Anion gap 7. 6. CKD stage 4. Cr is trending up. Continue to monitor in the setting of diuresis.  7. Tobacco use disorder. He is in remission.  DVT prophylaxis: Xarelto Code Status: Full Family Communication: Discussed with patient and family at bedside. Disposition Plan: Discharge home once improved   Consultants:   none  Procedures:   BiPAP  Antimicrobials:   Vancomycin 6/13 >> 6/14  Cefepime 6/13 >>    Subjective: Feels much improved. He is urinating well.  Objective: Filed Vitals:   06/18/15 0700 06/18/15 0800 06/18/15 0900 06/18/15 1000  BP: 111/89 120/84 106/76 120/74  Pulse: 92 41 95 68  Temp:  97 F (36.1 C)    TempSrc:  Oral    Resp: 28 27 36 37  Height:      Weight:      SpO2: 90% 90% 95% 90%    Intake/Output Summary (Last 24 hours) at 06/18/15 1115 Last data filed at 06/18/15 1115  Gross per 24 hour  Intake    290 ml  Output   4300 ml  Net  -4010 ml   Filed Weights   06/17/15 0857 06/17/15 1450 06/18/15 0500  Weight: 90.719 kg (200 lb) 94.5 kg (208 lb 5.4 oz) 94.5 kg (208 lb 5.4 oz)    Examination:  General exam: Appears calm and comfortable  Respiratory system: bilateral wheeze.  Cardiovascular system: S1 & S2 heard. No JVD, murmurs, rubs, gallops or clicks.  trace edema bilaterally, irregular heart sounds.  Gastrointestinal system: Abdomen is nondistended, soft and nontender. No organomegaly or masses felt. Normal bowel sounds heard. Central nervous system: Alert and oriented. No focal neurological deficits. Extremities: Symmetric 5 x 5 power. Skin: No rashes, lesions or ulcers Psychiatry: Judgement and insight appear normal. Mood & affect appropriate.    Data Reviewed: I have personally reviewed following labs and imaging studies  CBC:  Recent Labs Lab  06/17/15 0945 06/18/15 0209  WBC 7.5 5.9  NEUTROABS 6.0  --   HGB 10.9* 9.5*  HCT 34.4* 29.3*  MCV 89.1 87.5  PLT 267 0000000   Basic Metabolic Panel:  Recent Labs Lab 06/11/15 1457 06/17/15 0945 06/18/15 0209  NA 135 134* 135  K 4.6 4.3 4.7  CL 105 102 102  CO2 23 22 26   GLUCOSE 159* 147* 169*  BUN 24* 24* 25*  CREATININE 2.27* 2.06* 2.21*  CALCIUM 9.2 9.1 8.5*   GFR: Estimated Creatinine Clearance: 32 mL/min (by C-G formula based on Cr of 2.21). Liver Function Tests:Coagulation Profile:  Recent Labs Lab 06/17/15 0930  INR 2.65*   Cardiac Enzymes:  Recent Labs Lab 06/17/15 1431 06/17/15 2002 06/18/15 0209  TROPONINI <0.03 0.03 <0.03   Sepsis Labs: @LABRCNTIP (procalcitonin:4,lacticidven:4)  ) Recent Results (from the past 240 hour(s))  Culture, blood (Routine X 2) w Reflex to ID Panel     Status: None (Preliminary result)   Collection Time: 06/17/15 11:57 AM  Result Value Ref Range Status   Specimen Description BLOOD  Final   Special Requests NONE  Final   Culture NO GROWTH < 24 HOURS  Final   Report Status PENDING  Incomplete  Culture, blood (Routine X 2) w Reflex to ID Panel     Status: None (Preliminary result)   Collection Time: 06/17/15 12:05 PM  Result Value Ref Range Status   Specimen Description BLOOD  Final   Special Requests NONE  Final   Culture NO GROWTH < 24 HOURS  Final   Report Status PENDING  Incomplete  MRSA PCR Screening     Status: None   Collection Time: 06/17/15  2:30 PM  Result Value Ref Range Status   MRSA by PCR NEGATIVE NEGATIVE Final    Comment:        The GeneXpert MRSA Assay (FDA approved for NASAL specimens only), is one component of a comprehensive MRSA colonization surveillance program. It is not intended to diagnose MRSA infection nor to guide or monitor treatment for MRSA infections.   Culture, sputum-assessment     Status: None   Collection Time: 06/17/15 11:21 PM  Result Value Ref Range Status   Specimen  Description SPUTUM  Final   Special Requests NONE  Final   Sputum evaluation   Final    THIS SPECIMEN IS ACCEPTABLE. RESPIRATORY CULTURE REPORT TO FOLLOW.   Report Status 06/18/2015 FINAL  Final    Radiology Studies: Dg Chest 2 View  06/17/2015  CLINICAL DATA:  Shortness of breath and chest pain for approximately 2 weeks EXAM: CHEST  2 VIEW COMPARISON:  Jun 03, 2015 FINDINGS: There is airspace consolidation in the right base. There are small pleural effusions bilaterally. There is atelectatic change in the left base. There is mild underlying interstitial edema. Heart is mildly enlarged with mild pulmonary venous hypertension. No adenopathy evident. No bone lesions. No pneumothorax. IMPRESSION: Evidence of a degree of congestive heart failure. Suspect superimposed pneumonia right base. Mild atelectasis noted in left base. Electronically Signed  By: Lowella Grip III M.D.   On: 06/17/2015 10:01   Scheduled Meds: . aspirin EC  81 mg Oral Daily  . ceFEPime (MAXIPIME) IV  2 g Intravenous Q24H  . diltiazem  30 mg Oral Q12H  . furosemide  40 mg Intravenous Q12H  . gabapentin  300 mg Oral BID  . methylPREDNISolone (SOLU-MEDROL) injection  40 mg Intravenous Daily  . nicotine  14 mg Transdermal Daily  . pramipexole  1 mg Oral QHS  . rivaroxaban  15 mg Oral Q supper  . sodium chloride flush  3 mL Intravenous Q12H  . sodium chloride flush  3 mL Intravenous Q12H  . traZODone  150 mg Oral QHS   Continuous Infusions:    LOS: 1 day   Time spent: 25 minutes   Kathie Dike, MD [facility] Pager [phone]  If 7PM-7AM, please contact night-coverage [email or web address] Password [email or web address] 06/18/2015, 11:15 AM   By signing my name below, I, Delene Ruffini, attest that this documentation has been prepared under the direction and in the presence of Kathie Dike, MD. Electronically Signed: Delene Ruffini 06/18/2015 10:30am  I, Dr. Kathie Dike, personally performed the services  described in this documentaiton. All medical record entries made by the scribe were at my direction and in my presence. I have reviewed the chart and agree that the record reflects my personal performance and is accurate and complete  Kathie Dike, MD, 06/18/2015 11:15 AM

## 2015-06-19 ENCOUNTER — Ambulatory Visit: Payer: Self-pay | Admitting: *Deleted

## 2015-06-19 DIAGNOSIS — J810 Acute pulmonary edema: Secondary | ICD-10-CM

## 2015-06-19 LAB — CBC
HCT: 33 % — ABNORMAL LOW (ref 39.0–52.0)
Hemoglobin: 10.7 g/dL — ABNORMAL LOW (ref 13.0–17.0)
MCH: 28.4 pg (ref 26.0–34.0)
MCHC: 32.4 g/dL (ref 30.0–36.0)
MCV: 87.5 fL (ref 78.0–100.0)
PLATELETS: 304 10*3/uL (ref 150–400)
RBC: 3.77 MIL/uL — ABNORMAL LOW (ref 4.22–5.81)
RDW: 15.2 % (ref 11.5–15.5)
WBC: 12.1 10*3/uL — ABNORMAL HIGH (ref 4.0–10.5)

## 2015-06-19 LAB — BASIC METABOLIC PANEL
ANION GAP: 9 (ref 5–15)
BUN: 37 mg/dL — AB (ref 6–20)
CALCIUM: 9 mg/dL (ref 8.9–10.3)
CO2: 28 mmol/L (ref 22–32)
Chloride: 99 mmol/L — ABNORMAL LOW (ref 101–111)
Creatinine, Ser: 2.36 mg/dL — ABNORMAL HIGH (ref 0.61–1.24)
GFR calc Af Amer: 29 mL/min — ABNORMAL LOW (ref >60)
GFR, EST NON AFRICAN AMERICAN: 25 mL/min — AB (ref >60)
GLUCOSE: 150 mg/dL — AB (ref 65–99)
Potassium: 4.3 mmol/L (ref 3.5–5.1)
Sodium: 136 mmol/L (ref 135–145)

## 2015-06-19 NOTE — Progress Notes (Signed)
PROGRESS NOTE    Dylan Williamson  [id number] DOB: 05-23-1934 DOA: 06/17/2015 PCP: Rory Percy, MD  Outpatient Specialists:    Brief Narrative:  Patient presented with complaints of chest tightness and was found to have RLL PNA with acute hypoxic respiratory failure. While in the ED, he was started on BiPAP, empiric antibiotics, and admitted to stepdown.  His Vancomycin discontinued 06/14.    Assessment & Plan:   Principal Problem:   Healthcare-associated pneumonia Active Problems:   Atrial fibrillation ([place])   Acute respiratory failure with hypoxia (HCC)   Acute pulmonary edema (HCC)   DM type 2 (diabetes mellitus, type 2) (HCC)   Acute diastolic CHF (congestive heart failure) (HCC)   CKD (chronic kidney disease) stage 4, GFR 15-29 ml/min ([place])  1. Acute hypoxic respiratory failure, secondary to RLL PNA/ CHF. Patient is not chronically on home O2. Patient's respiratory status continues to improved greatly. He has been transitioned off of BiPAP and onto [place] without issues. Patient was noted to be sitting without oxygen today with sats in the low 90's. Will give a trial off of oxygen today and monitor O2 levels.  2. HCAP. RLL PNA revealed on CXR. Lactic acid reassuring. Strep pneumo antigen negative.  Follow up cultures. Continue steroids, bronchodilators PRN. Vancomycin discontinued due to MRSA PCR being negative. Cefepime for now. WBC elevated likely due to steroids.  3. Acute diastolic heart failure. BNP mildly elevated at 290. Recent ECHO shows normal LVEF, but study was not technically sufficient to comment on diastolic function. Patient was started on IV Lasix and was diuresed with great improvement in volume status. Overall respiratory status appears to be improving. Continue current treatments. Net volume status since admission is -5.5L. 4. Persistent atrial fibrillation with RVR. This is a recent diagnosis; he was initially on Eliquis, but was recently changed to Xarelto due  to complaints of dizziness. He is not on any nodal agents. CHADVASC=4. Troponin has remained negative and he has no complaints of chest pain. Continue low dose oral diltiazem.  5. DM type 2. With neuropathy. Blood sugars remain stable.  6. CKD stage IV. Mild elevation of creatinine with diuresis, continue to follow.  7. Tobacco use disorder. He is in remission.  DVT prophylaxis: Xarelto Code Status: Full Family Communication: Discussed with patient and family at bedside. Disposition Plan: Transfer to medical bed. Anticipate discharge home in 24 hours.    Consultants:   none  Procedures:   None  Antimicrobials:   Vancomycin 6/13 >> 6/14  Cefepime 6/13 >>    Subjective: Feels like his breathing has improved. Productive cough improved. Wheezing has resolved. Has mild joint point that has been chronic.  Objective: Filed Vitals:   06/19/15 0230 06/19/15 0300 06/19/15 0400 06/19/15 0500  BP:   118/70   Pulse:  95 90 33  Temp:   97 F (36.1 C)   TempSrc:   Oral   Resp:  19 23 28   Height:      Weight:    90.8 kg (200 lb 2.8 oz)  SpO2: 92% 94% 92% 93%    Intake/Output Summary (Last 24 hours) at 06/19/15 0627 Last data filed at 06/18/15 2350  Gross per 24 hour  Intake    530 ml  Output   2825 ml  Net  -2295 ml   Filed Weights   06/17/15 1450 06/18/15 0500 06/19/15 0500  Weight: 94.5 kg (208 lb 5.4 oz) 94.5 kg (208 lb 5.4 oz) 90.8 kg (200 lb 2.8 oz)  Examination:  General exam: Appears calm and comfortable  Respiratory system: CTAB   Cardiovascular system:  No JVD, murmurs, rubs, gallops or clicks. 1+ pitting edema bilaterally, irregular heart sounds.  Gastrointestinal system: Abdomen is nondistended, soft and nontender. No organomegaly or masses felt. Normal bowel sounds heard. Central nervous system: Alert and oriented. No focal neurological deficits. Extremities: Symmetric 5 x 5 power. Psychiatry: Judgement and insight appear normal. Mood & affect appropriate.     Data Reviewed: I have personally reviewed following labs and imaging studies  CBC:  Recent Labs Lab 06/17/15 0945 06/18/15 0209 06/19/15 0449  WBC 7.5 5.9 12.1*  NEUTROABS 6.0  --   --   HGB 10.9* 9.5* 10.7*  HCT 34.4* 29.3* 33.0*  MCV 89.1 87.5 87.5  PLT 267 246 [id number]   Basic Metabolic Panel:  Recent Labs Lab 06/17/15 0945 06/18/15 0209 06/19/15 0449  NA 134* 135 136  K 4.3 4.7 4.3  CL 102 102 99*  CO2 22 26 28   GLUCOSE 147* 169* 150*  BUN 24* 25* 37*  CREATININE 2.06* 2.21* 2.36*  CALCIUM 9.1 8.5* 9.0   GFR: Estimated Creatinine Clearance: 30 mL/min (by C-G formula based on Cr of 2.36). Liver Function Tests:Coagulation Profile:  Recent Labs Lab 06/17/15 0930  INR 2.65*   Cardiac Enzymes:  Recent Labs Lab 06/17/15 1431 06/17/15 2002 06/18/15 0209  TROPONINI <0.03 0.03 <0.03   Sepsis Labs: @LABRCNTIP (procalcitonin:4,lacticidven:4)  ) Recent Results (from the past 240 hour(s))  Culture, blood (Routine X 2) w Reflex to ID Panel     Status: None (Preliminary result)   Collection Time: 06/17/15 11:57 AM  Result Value Ref Range Status   Specimen Description BLOOD  Final   Special Requests NONE  Final   Culture NO GROWTH < 24 HOURS  Final   Report Status PENDING  Incomplete  Culture, blood (Routine X 2) w Reflex to ID Panel     Status: None (Preliminary result)   Collection Time: 06/17/15 12:05 PM  Result Value Ref Range Status   Specimen Description BLOOD  Final   Special Requests NONE  Final   Culture NO GROWTH < 24 HOURS  Final   Report Status PENDING  Incomplete  MRSA PCR Screening     Status: None   Collection Time: 06/17/15  2:30 PM  Result Value Ref Range Status   MRSA by PCR NEGATIVE NEGATIVE Final    Comment:        The GeneXpert MRSA Assay (FDA approved for NASAL specimens only), is one component of a comprehensive MRSA colonization surveillance program. It is not intended to diagnose MRSA infection nor to guide or monitor  treatment for MRSA infections.   Culture, sputum-assessment     Status: None   Collection Time: 06/17/15 11:21 PM  Result Value Ref Range Status   Specimen Description SPUTUM  Final   Special Requests NONE  Final   Sputum evaluation   Final    THIS SPECIMEN IS ACCEPTABLE. RESPIRATORY CULTURE REPORT TO FOLLOW.   Report Status 06/18/2015 FINAL  Final  Culture, respiratory (NON-Expectorated)     Status: None (Preliminary result)   Collection Time: 06/17/15 11:21 PM  Result Value Ref Range Status   Specimen Description SPUTUM  Final   Special Requests NONE  Final   Gram Stain   Final    FEW WBC PRESENT,BOTH PMN AND MONONUCLEAR RARE GRAM NEGATIVE RODS FEW SQUAMOUS EPITHELIAL CELLS PRESENT Performed at [facility]    Culture PENDING  Incomplete  Report Status PENDING  Incomplete    Radiology Studies: Dg Chest 2 View  06/17/2015  CLINICAL DATA:  Shortness of breath and chest pain for approximately 2 weeks EXAM: CHEST  2 VIEW COMPARISON:  Jun 03, 2015 FINDINGS: There is airspace consolidation in the right base. There are small pleural effusions bilaterally. There is atelectatic change in the left base. There is mild underlying interstitial edema. Heart is mildly enlarged with mild pulmonary venous hypertension. No adenopathy evident. No bone lesions. No pneumothorax. IMPRESSION: Evidence of a degree of congestive heart failure. Suspect superimposed pneumonia right base. Mild atelectasis noted in left base. Electronically Signed   By: Lowella Grip III M.D.   On: 06/17/2015 10:01   Scheduled Meds: . aspirin EC  81 mg Oral Daily  . ceFEPime (MAXIPIME) IV  2 g Intravenous Q24H  . diltiazem  30 mg Oral Q12H  . furosemide  40 mg Intravenous Q12H  . gabapentin  300 mg Oral BID  . guaiFENesin  1,200 mg Oral BID  . ipratropium  0.5 mg Nebulization Q6H  . levalbuterol  0.63 mg Nebulization Q6H  . methylPREDNISolone (SOLU-MEDROL) injection  40 mg Intravenous Daily  . nicotine  14 mg  Transdermal Daily  . pramipexole  1 mg Oral QHS  . rivaroxaban  15 mg Oral Q supper  . sodium chloride flush  3 mL Intravenous Q12H  . sodium chloride flush  3 mL Intravenous Q12H  . traZODone  150 mg Oral QHS   Continuous Infusions:    LOS: 2 days   Time spent: 25 minutes   Kathie Dike, MD [facility] Pager [phone]  If 7PM-7AM, please contact night-coverage [email or web address] Password [email or web address] 06/19/2015, 6:27 AM    By signing my name below, I, Rennis Harding, attest that this documentation has been prepared under the direction and in the presence of Kathie Dike, MD. Electronically signed: Rennis Harding, Scribe. 06/19/2015 8:54am   I, Dr. Kathie Dike, personally performed the services described in this documentaiton. All medical record entries made by the scribe were at my direction and in my presence. I have reviewed the chart and agree that the record reflects my personal performance and is accurate and complete  Kathie Dike, MD, 06/19/2015 9:11 AM

## 2015-06-19 NOTE — Consult Note (Signed)
   [facility] CM Inpatient Consult   06/19/2015  Pierre Blauser 1934-09-15 ED:[id number]  Patient is currently active with [facility] for chronic disease management services.  Patient has been engaged by a [employer].  Our community based plan of care has focused on disease management and community resource support.  Patient will receive a post discharge transition of care call and will be evaluated for monthly home visits for assessments and disease process education.  Made Inpatient Case Manager aware that [facility] following. Of note, [facility] Care Management services does not replace or interfere with any services that are arranged by inpatient case management or social work.   For additional questions or referrals please contact:  Royetta Crochet. [name], RN, BSN, [place] [phone]

## 2015-06-20 LAB — CULTURE, RESPIRATORY W GRAM STAIN: Culture: NORMAL

## 2015-06-20 MED ORDER — DILTIAZEM HCL 30 MG PO TABS: 30.0000 mg | ORAL_TABLET | Freq: Two times a day (BID) | ORAL | Status: DC

## 2015-06-20 MED ORDER — RIVAROXABAN 15 MG PO TABS: 15.0000 mg | ORAL_TABLET | Freq: Every day | ORAL | Status: DC

## 2015-06-20 MED ORDER — LEVALBUTEROL HCL 0.63 MG/3ML IN NEBU: 0.6300 mg | INHALATION_SOLUTION | Freq: Four times a day (QID) | RESPIRATORY_TRACT | Status: DC

## 2015-06-20 MED ORDER — AMOXICILLIN-POT CLAVULANATE 875-125 MG PO TABS: 1.0000 | ORAL_TABLET | Freq: Two times a day (BID) | ORAL | Status: DC

## 2015-06-20 MED ORDER — FUROSEMIDE 40 MG PO TABS: 40.0000 mg | ORAL_TABLET | Freq: Every day | ORAL | Status: DC

## 2015-06-20 MED ORDER — GUAIFENESIN ER 600 MG PO TB12: 600.0000 mg | ORAL_TABLET | Freq: Two times a day (BID) | ORAL | Status: DC

## 2015-06-20 NOTE — Progress Notes (Signed)
SATURATION QUALIFICATIONS: (This note is used to comply with regulatory documentation for home oxygen)  Patient Saturations on Room Air at Rest =94%  Patient Saturations on Room Air while Ambulating =95%   

## 2015-06-20 NOTE — Discharge Summary (Signed)
Physician Discharge Summary  Dylan Williamson [id number] DOB: 1934-01-20 DOA: 06/17/2015  PCP: Rory Percy, MD  Admit date: 06/17/2015 Discharge date: 06/20/2015  Time spent: 35 minutes  Recommendations for Outpatient Follow-up:  1. Follow up with PCP in 1-2 weeks 2. Follow up for repeat BMET in 1 week 3. Follow up with cardiologist on Monday. 4. Will be discharged with lasix 5. He will be discharged with a course of oral Augmentin which he will finish on 6/18. 6. Discharge with breathing treatments.   Discharge Diagnoses:  Principal Problem:   Healthcare-associated pneumonia Active Problems:   Atrial fibrillation (HCC)   Acute respiratory failure with hypoxia (HCC)   Acute pulmonary edema (HCC)   DM type 2 (diabetes mellitus, type 2) (HCC)   Acute diastolic CHF (congestive heart failure) (HCC)   CKD (chronic kidney disease) stage 4, GFR 15-29 ml/min [facility])   Discharge Condition: improved  Diet recommendation: carb modified, heart healthy  Filed Weights   06/17/15 1450 06/18/15 0500 06/19/15 0500  Weight: 94.5 kg (208 lb 5.4 oz) 94.5 kg (208 lb 5.4 oz) 90.8 kg (200 lb 2.8 oz)    History of present illness:  81 yom with a hx of HTN, CKD stage 3, DM type 2, and a-fib, presented with complaints of shortness of breath, chest tightness, and cough. Initial evaluation showed RLL PNA with acute hypoxic respiratory failure. He reported being in his usual state of health until approximately 2 weeks prior to admission when he experienced a syncopal episode with associated lightheadedness. He was subsequently started on Eliquis, but due to complaints of dizziness, was transitioned to Xarelto. His daughter reported that when she checked on him the night before admission, he was experiencing labored breathing, which dramatically worsened over night. Consequently, she brought him to the hospital where he was admitted for HCAP.   Hospital Course:  Patient was found to have acute  respiratory failure secondary to HCAP and CHF. He was initially started on BiPAP and admitted to the ICU. His respiratory status began to improve as his volume status improved wtih diuresis via IV Lasix. Subsequently, he was transitioned out of the ICU and started on nasal cannula, which he tolerated without any issues. He underwent a walking trial on 6/16 without any supplemental O2 and appeared to ambulate without any signs of distress. His lungs also sounded clear and he did not appear to be wheezing. He appears to be approaching euvolemia and IV lasix has been changed to PO. He is not a candidate for ACE inhibitors due to renal dysfunction, would avoid BB at this time due to possible hx of COPD.  On admission, patient was found to have HCAP. He was started on empiric antibiotics for treatment of RLL PNA and cultures were collected and sent for further testing. Strep pneumo antigen was ordered and returned negative and MRSA PCR was also noted to be negative so Vancomycin was discontinued, and he was continued on Cefepime. He will be discharged with breathing treatments anda course of oral Augmentin that he will finish on 6/18. Patient was recently diagnosed with persistent atrial fibrillation. He did not complaints of any chest pain throughout his hospital stay and his troponin remianed negative. He was started on diltiazem for rate control and in anticoagulated with Xarelto. He will need to follow up with Dr. Harl Bowie of cardiology as an outpatient. He will also need to follow up with his PCP in 1-2 weeks.   Procedures:  none  Consultations:  none  Discharge Exam:  Filed Vitals:   06/20/15 1336 06/20/15 1414  BP: 106/78   Pulse: 82   Temp: 97.7 F (36.5 C)   Resp: 20 21    Examination:  General exam: Appears calm and comfortable  Respiratory system: Clear to auscultation. Respiratory effort normal. Cardiovascular system: S1 & S2 heard, Irregular heart rate. No JVD, murmurs, rubs, gallops or  clicks. Trace pedal edema. Gastrointestinal system: Abdomen is nondistended, soft and nontender. No organomegaly or masses felt. Normal bowel sounds heard. Central nervous system: Alert and oriented. No focal neurological deficits. Extremities: Symmetric 5 x 5 power. Skin: No rashes, lesions or ulcers Psychiatry: Judgement and insight appear normal. Mood & affect appropriate.   Discharge Instructions   Discharge Instructions    DME Nebulizer machine    Complete by:  As directed      Diet - low sodium heart healthy    Complete by:  As directed      Face-to-face encounter (required for Medicare/Medicaid patients)    Complete by:  As directed   I Samiah Ricklefs certify that this patient is under my care and that I, or a nurse practitioner or physician's assistant working with me, had a face-to-face encounter that meets the physician face-to-face encounter requirements with this patient on 06/20/2015. The encounter with the patient was in whole, or in part for the following medical condition(s) which is the primary reason for home health care (List medical condition): chf exacerbation, copd  The encounter with the patient was in whole, or in part, for the following medical condition, which is the primary reason for home health care:  chf exacerbation  I certify that, based on my findings, the following services are medically necessary home health services:  Nursing  Reason for Medically Necessary Home Health Services:  Skilled Nursing- Skilled Assessment/Observation  My clinical findings support the need for the above services:  Shortness of breath with activity  Further, I certify that my clinical findings support that this patient is homebound due to:  Shortness of Breath with activity     Home Health    Complete by:  As directed   To provide the following care/treatments:  RN     Increase activity slowly    Complete by:  As directed           Current Discharge Medication List    START  taking these medications   Details  amoxicillin-clavulanate (AUGMENTIN) 875-125 MG tablet Take 1 tablet by mouth 2 (two) times daily. Qty: 4 tablet, Refills: 0    diltiazem (CARDIZEM) 30 MG tablet Take 1 tablet (30 mg total) by mouth 2 (two) times daily. Qty: 60 tablet, Refills: 0    furosemide (LASIX) 40 MG tablet Take 1 tablet (40 mg total) by mouth daily. Qty: 30 tablet, Refills: 0    guaiFENesin (MUCINEX) 600 MG 12 hr tablet Take 1 tablet (600 mg total) by mouth 2 (two) times daily. Qty: 30 tablet, Refills: 0    levalbuterol (XOPENEX) 0.63 MG/3ML nebulizer solution Take 3 mLs (0.63 mg total) by nebulization every 6 (six) hours. Qty: 3 mL, Refills: 12      CONTINUE these medications which have CHANGED   Details  rivaroxaban (XARELTO) 15 MG TABS tablet Take 1 tablet (15 mg total) by mouth daily with supper. Qty: 30 tablet, Refills: 1      CONTINUE these medications which have NOT CHANGED   Details  cholecalciferol (VITAMIN D) 1000 units tablet Take 1,000 Units by mouth daily.    gabapentin (  NEURONTIN) 300 MG capsule Take 300 mg by mouth 2 (two) times daily.    glyBURIDE (DIABETA) 5 MG tablet Take 10 mg by mouth 2 (two) times daily with a meal.     Multiple Vitamins-Minerals (MULTIVITAMIN WITH MINERALS) tablet Take 1 tablet by mouth daily.    nicotine (NICODERM CQ - DOSED IN MG/24 HOURS) 14 mg/24hr patch Place 14 mg onto the skin daily.    Omega-3 Fatty Acids (FISH OIL) 1000 MG CAPS Take 1 capsule by mouth 2 (two) times daily.    pramipexole (MIRAPEX) 1 MG tablet Take 1 mg by mouth at bedtime.    traZODone (DESYREL) 150 MG tablet Take 150 mg by mouth at bedtime.       STOP taking these medications     lisinopril (PRINIVIL,ZESTRIL) 40 MG tablet        No Known Allergies Follow-up Information    Follow up with Rory Percy, MD.   Specialty:  Family Medicine   Why:  follow up within 1-2 weeks   Contact information:   [address] [place] [id number] [phone]         The results of significant diagnostics from this hospitalization (including imaging, microbiology, ancillary and laboratory) are listed below for reference.    Significant Diagnostic Studies: Dg Chest 2 View  06/17/2015  CLINICAL DATA:  Shortness of breath and chest pain for approximately 2 weeks EXAM: CHEST  2 VIEW COMPARISON:  Jun 03, 2015 FINDINGS: There is airspace consolidation in the right base. There are small pleural effusions bilaterally. There is atelectatic change in the left base. There is mild underlying interstitial edema. Heart is mildly enlarged with mild pulmonary venous hypertension. No adenopathy evident. No bone lesions. No pneumothorax. IMPRESSION: Evidence of a degree of congestive heart failure. Suspect superimposed pneumonia right base. Mild atelectasis noted in left base. Electronically Signed   By: Lowella Grip III M.D.   On: 06/17/2015 10:01   Dg Chest 2 View  06/03/2015  CLINICAL DATA:  81 year old current smoker with hypertension and diabetes, presenting with bradycardia. EXAM: CHEST  2 VIEW COMPARISON:  06/27/2014. FINDINGS: Cardiac silhouette mildly to moderately enlarged with significant interval increase in heart size since the prior examination. Thoracic aorta tortuous and atherosclerotic, unchanged. Hilar and mediastinal contours otherwise unremarkable. Emphysematous changes in the upper lobes. Mild pulmonary venous hypertension without overt edema. Small bilateral pleural effusions. Linear atelectasis in the lower lobes and right middle lobe. No confluent airspace consolidation. Degenerative changes involving the thoracic spine. IMPRESSION: 1. Small bilateral pleural effusions. 2. Linear atelectasis in the lower lobes right middle lobe. 3. Mild to moderate cardiomegaly with significant interval increase in heart size since June, 2016. Pulmonary venous hypertension without overt edema. Electronically Signed   By: Evangeline Dakin M.D.   On: 06/03/2015 14:32    Ct Head Wo Contrast  06/03/2015  CLINICAL DATA:  Dizziness and syncope EXAM: CT HEAD WITHOUT CONTRAST TECHNIQUE: Contiguous axial images were obtained from the base of the skull through the vertex without intravenous contrast. COMPARISON:  None. FINDINGS: There is mild diffuse atrophy. There is no intracranial mass hemorrhage, extra-axial fluid collection, or midline shift. There is slight small vessel disease in the centra semiovale bilaterally. No acute infarct is evident. A small amount of benign calcification is noted in medial right temporal lobe region. The bony calvarium appears intact although somewhat osteoporotic. The mastoid air cells clear. No intraorbital lesions are evident. There are foci of calcification in the cavernous carotid artery regions  as well as in both distal vertebral arteries. IMPRESSION: Mild atrophy with mild periventricular small vessel disease. No intracranial mass, hemorrhage, or evidence of acute infarct. Electronically Signed   By: Lowella Grip III M.D.   On: 06/03/2015 16:05   US Renal  06/03/2015  CLINICAL DATA:  Acute kidney injury.  Diabetes mellitus. EXAM: RENAL / URINARY TRACT ULTRASOUND COMPLETE COMPARISON:  None. FINDINGS: Right Kidney: Length: 11.7 cm. Diffuse renal parenchymal thinning and increased echogenicity noted. Several small right renal cysts also noted. No mass or hydronephrosis visualized. Left Kidney: Length: 11.6 cm. Diffuse renal parenchymal thinning and increased echogenicity noted. Bilobed cyst or 2 adjacent cysts seen in the midpole measuring up to 4.3 x 2.3 cm. No mass or hydronephrosis visualized. Bladder: Appears normal for degree of bladder distention. Incidentally noted is borderline splenomegaly with spleen measuring approximately 13 cm in length and bilateral pleural effusions. IMPRESSION: Bilateral renal parenchymal thinning/ atrophy and increased echogenicity, consistent chronic medical renal disease. No evidence of hydronephrosis.  Bilateral renal cysts. Incidentally noted bilateral pleural effusions and borderline splenomegaly. Electronically Signed   By: Earle Gell M.D.   On: 06/03/2015 18:19   Nm Pulmonary Perf And Vent  06/03/2015  CLINICAL DATA:  Persistent cough, passed out 2 days ago, bradycardia EXAM: NUCLEAR MEDICINE VENTILATION - PERFUSION LUNG SCAN TECHNIQUE: Ventilation images were obtained in multiple projections using inhaled aerosol Tc-70m DTPA. Perfusion images were obtained in multiple projections after intravenous injection of Tc-15m MAA. RADIOPHARMACEUTICALS:  30 mCi Technetium-24m DTPA aerosol inhalation and 4.4 mCi Technetium-49m MAA IV COMPARISON:  Correlation with chest radiograph dated 06/03/2015 FINDINGS: Ventilation: Heterogeneous ventilation with clumping in the central airways, compatible with COPD. Perfusion: Mildly heterogeneous perfusion. No wedge shaped peripheral perfusion defects to suggest acute pulmonary embolism. Corresponding chest radiograph demonstrates mild bibasilar atelectasis and small bilateral pleural effusions. IMPRESSION: Low probability for pulmonary embolism. Electronically Signed   By: Julian Hy M.D.   On: 06/03/2015 19:39    Microbiology: Recent Results (from the past 240 hour(s))  Culture, blood (Routine X 2) w Reflex to ID Panel     Status: None (Preliminary result)   Collection Time: 06/17/15 11:57 AM  Result Value Ref Range Status   Specimen Description BLOOD RIGHT HAND  Final   Special Requests BOTTLES DRAWN AEROBIC ONLY 4CC  Final   Culture NO GROWTH 2 DAYS  Final   Report Status PENDING  Incomplete  Culture, blood (Routine X 2) w Reflex to ID Panel     Status: None (Preliminary result)   Collection Time: 06/17/15 12:05 PM  Result Value Ref Range Status   Specimen Description BLOOD LEFT ANTECUBITAL  Final   Special Requests BOTTLES DRAWN AEROBIC AND ANAEROBIC 6CC EACH  Final   Culture NO GROWTH 2 DAYS  Final   Report Status PENDING  Incomplete  MRSA PCR  Screening     Status: None   Collection Time: 06/17/15  2:30 PM  Result Value Ref Range Status   MRSA by PCR NEGATIVE NEGATIVE Final    Comment:        The GeneXpert MRSA Assay (FDA approved for NASAL specimens only), is one component of a comprehensive MRSA colonization surveillance program. It is not intended to diagnose MRSA infection nor to guide or monitor treatment for MRSA infections.   Culture, sputum-assessment     Status: None   Collection Time: 06/17/15 11:21 PM  Result Value Ref Range Status   Specimen Description SPUTUM  Final   Special Requests NONE  Final   Sputum evaluation   Final    THIS SPECIMEN IS ACCEPTABLE. RESPIRATORY CULTURE REPORT TO FOLLOW.   Report Status 06/18/2015 FINAL  Final  Culture, respiratory (NON-Expectorated)     Status: None   Collection Time: 06/17/15 11:21 PM  Result Value Ref Range Status   Specimen Description SPUTUM  Final   Special Requests NONE  Final   Gram Stain   Final    FEW WBC PRESENT,BOTH PMN AND MONONUCLEAR RARE GRAM NEGATIVE RODS FEW SQUAMOUS EPITHELIAL CELLS PRESENT    Culture   Final    Consistent with normal respiratory flora. Performed at [facility]    Report Status 06/20/2015 FINAL  Final     Labs: Basic Metabolic Panel:  Recent Labs Lab 06/17/15 0945 06/18/15 0209 06/19/15 0449  NA 134* 135 136  K 4.3 4.7 4.3  CL 102 102 99*  CO2 22 26 28   GLUCOSE 147* 169* 150*  BUN 24* 25* 37*  CREATININE 2.06* 2.21* 2.36*  CALCIUM 9.1 8.5* 9.0   CBC:  Recent Labs Lab 06/17/15 0945 06/18/15 0209 06/19/15 0449  WBC 7.5 5.9 12.1*  NEUTROABS 6.0  --   --   HGB 10.9* 9.5* 10.7*  HCT 34.4* 29.3* 33.0*  MCV 89.1 87.5 87.5  PLT 267 246 304   Cardiac Enzymes:  Recent Labs Lab 06/17/15 1431 06/17/15 2002 06/18/15 0209  TROPONINI <0.03 0.03 <0.03   BNP: BNP (last 3 results)  Recent Labs  06/03/15 1404 06/17/15 0945  BNP 141.0* 290.0*    Signed: Kathie Dike, MD.  [facility] 06/20/2015, 3:06 PM  By signing my name below, I, Delene Ruffini, attest that this documentation has been prepared under the direction and in the presence of Kathie Dike, MD. Electronically Signed: Delene Ruffini 06/20/2015 2:43pm  I, Dr. Kathie Dike, personally performed the services described in this documentaiton. All medical record entries made by the scribe were at my direction and in my presence. I have reviewed the chart and agree that the record reflects my personal performance and is accurate and complete  Kathie Dike, MD, 06/20/2015 3:06 PM

## 2015-06-20 NOTE — Care Management Important Message (Signed)
Important Message  Patient Details  Name: Dylan Williamson MRN: [id number] Date of Birth: 1934-02-19   Medicare Important Message Given:  Yes    Sherald Barge, RN 06/20/2015, 9:28 AM

## 2015-06-20 NOTE — Progress Notes (Signed)
Discharged PT per MD order and protocol. Discharge handouts reviewed/explained. Education completed. Prescriptions given and explained. Pt verbalized understanding and left with all belongings. VSS. IV catheter D/C.  Patient wheeled down by staff member. Oswald Hillock, RN

## 2015-06-20 NOTE — Progress Notes (Signed)
Pharmacy Antibiotic Note  Dylan Williamson is a 81 y.o. male admitted on 06/17/2015 with pneumonia.  Pharmacy has been consulted for cefepime dosing.  SCr remains elevated, appears relatively stable.   Plan: Continue Cefepime 2 gm IV q24 hours F/u renal function, cultures and clinical course  Height: 6\' 2"  (188 cm) Weight: 200 lb 2.8 oz (90.8 kg) IBW/kg (Calculated) : 82.2  Temp (24hrs), Avg:97.8 F (36.6 C), Min:97.5 F (36.4 C), Max:98.4 F (36.9 C)   Recent Labs Lab 06/17/15 0945 06/17/15 1309 06/18/15 0209 06/19/15 0449  WBC 7.5  --  5.9 12.1*  CREATININE 2.06*  --  2.21* 2.36*  LATICACIDVEN  --  1.34  --   --     Estimated Creatinine Clearance: 30 mL/min (by C-G formula based on Cr of 2.36).    No Known Allergies  Antimicrobials this admission: 6/13 cefepime >>  6/13  Vanc>>6/15  Recent Results (from the past 240 hour(s))  Culture, blood (Routine X 2) w Reflex to ID Panel     Status: None (Preliminary result)   Collection Time: 06/17/15 11:57 AM  Result Value Ref Range Status   Specimen Description BLOOD RIGHT HAND  Final   Special Requests BOTTLES DRAWN AEROBIC ONLY 4CC  Final   Culture NO GROWTH 2 DAYS  Final   Report Status PENDING  Incomplete  Culture, blood (Routine X 2) w Reflex to ID Panel     Status: None (Preliminary result)   Collection Time: 06/17/15 12:05 PM  Result Value Ref Range Status   Specimen Description BLOOD LEFT ANTECUBITAL  Final   Special Requests BOTTLES DRAWN AEROBIC AND ANAEROBIC 6CC EACH  Final   Culture NO GROWTH 2 DAYS  Final   Report Status PENDING  Incomplete  MRSA PCR Screening     Status: None   Collection Time: 06/17/15  2:30 PM  Result Value Ref Range Status   MRSA by PCR NEGATIVE NEGATIVE Final    Comment:        The GeneXpert MRSA Assay (FDA approved for NASAL specimens only), is one component of a comprehensive MRSA colonization surveillance program. It is not intended to diagnose MRSA infection nor to guide  or monitor treatment for MRSA infections.   Culture, sputum-assessment     Status: None   Collection Time: 06/17/15 11:21 PM  Result Value Ref Range Status   Specimen Description SPUTUM  Final   Special Requests NONE  Final   Sputum evaluation   Final    THIS SPECIMEN IS ACCEPTABLE. RESPIRATORY CULTURE REPORT TO FOLLOW.   Report Status 06/18/2015 FINAL  Final  Culture, respiratory (NON-Expectorated)     Status: None (Preliminary result)   Collection Time: 06/17/15 11:21 PM  Result Value Ref Range Status   Specimen Description SPUTUM  Final   Special Requests NONE  Final   Gram Stain   Final    FEW WBC PRESENT,BOTH PMN AND MONONUCLEAR RARE GRAM NEGATIVE RODS FEW SQUAMOUS EPITHELIAL CELLS PRESENT    Culture   Final    CULTURE REINCUBATED FOR BETTER GROWTH Performed at [facility]    Report Status PENDING  Incomplete   Thank you for allowing pharmacy to be a part of this patient's care.  Hart Robinsons A 06/20/2015 11:51 AM

## 2015-06-20 NOTE — Care Management Note (Addendum)
Case Management Note  Patient Details  Name: Dylan Williamson MRN: [id number] Date of Birth: November 13, 1934  Subjective/Objective:                  Pt is from home and ind with ADL's. Pt ambulates independently, no cane or walker. Pt has no HH services prior to admission. Pt has PCP and drive himself to appointments. Pt active with [facility]. Pt plans to return home today with self care.   Action/Plan: Discharging home. No CM needs.   Expected Discharge Date:  06/20/15               Expected Discharge Plan:  Home/Self Care  In-House Referral:  NA  Discharge planning Services  CM Consult  Post Acute Care Choice:  NA Choice offered to:  NA  DME Arranged:    DME Agency:     HH Arranged:    HH Agency:     Status of Service:  Completed, signed off  Medicare Important Message Given:  Yes Date Medicare IM Given:    Medicare IM give by:    Date Additional Medicare IM Given:    Additional Medicare Important Message give by:     If discussed at [place] of Stay Meetings, dates discussed:    Additional Comments:  Sherald Barge, RN 06/20/2015, 9:29 AM

## 2015-06-22 LAB — CULTURE, BLOOD (ROUTINE X 2)
CULTURE: NO GROWTH
Culture: NO GROWTH

## 2015-06-23 ENCOUNTER — Encounter: Payer: Self-pay | Admitting: *Deleted

## 2015-06-23 ENCOUNTER — Other Ambulatory Visit: Payer: Self-pay | Admitting: *Deleted

## 2015-06-23 DIAGNOSIS — Z5181 Encounter for therapeutic drug level monitoring: Secondary | ICD-10-CM | POA: Diagnosis not present

## 2015-06-23 DIAGNOSIS — I10 Essential (primary) hypertension: Secondary | ICD-10-CM | POA: Diagnosis not present

## 2015-06-23 DIAGNOSIS — E1349 Other specified diabetes mellitus with other diabetic neurological complication: Secondary | ICD-10-CM | POA: Diagnosis not present

## 2015-06-23 DIAGNOSIS — Z716 Tobacco abuse counseling: Secondary | ICD-10-CM | POA: Diagnosis not present

## 2015-06-23 DIAGNOSIS — I4891 Unspecified atrial fibrillation: Secondary | ICD-10-CM | POA: Diagnosis not present

## 2015-06-23 DIAGNOSIS — F17219 Nicotine dependence, cigarettes, with unspecified nicotine-induced disorders: Secondary | ICD-10-CM | POA: Diagnosis not present

## 2015-06-23 DIAGNOSIS — J449 Chronic obstructive pulmonary disease, unspecified: Secondary | ICD-10-CM | POA: Diagnosis not present

## 2015-06-23 DIAGNOSIS — L989 Disorder of the skin and subcutaneous tissue, unspecified: Secondary | ICD-10-CM | POA: Diagnosis not present

## 2015-06-23 DIAGNOSIS — J441 Chronic obstructive pulmonary disease with (acute) exacerbation: Secondary | ICD-10-CM | POA: Diagnosis not present

## 2015-06-23 DIAGNOSIS — G2581 Restless legs syndrome: Secondary | ICD-10-CM | POA: Diagnosis not present

## 2015-06-23 DIAGNOSIS — I110 Hypertensive heart disease with heart failure: Secondary | ICD-10-CM | POA: Diagnosis not present

## 2015-06-23 DIAGNOSIS — R079 Chest pain, unspecified: Secondary | ICD-10-CM | POA: Diagnosis not present

## 2015-06-23 DIAGNOSIS — Z7984 Long term (current) use of oral hypoglycemic drugs: Secondary | ICD-10-CM | POA: Diagnosis not present

## 2015-06-23 DIAGNOSIS — I509 Heart failure, unspecified: Secondary | ICD-10-CM | POA: Diagnosis not present

## 2015-06-23 DIAGNOSIS — Z23 Encounter for immunization: Secondary | ICD-10-CM | POA: Diagnosis not present

## 2015-06-23 DIAGNOSIS — E1122 Type 2 diabetes mellitus with diabetic chronic kidney disease: Secondary | ICD-10-CM | POA: Diagnosis not present

## 2015-06-23 DIAGNOSIS — I482 Chronic atrial fibrillation: Secondary | ICD-10-CM | POA: Diagnosis not present

## 2015-06-23 DIAGNOSIS — I5033 Acute on chronic diastolic (congestive) heart failure: Secondary | ICD-10-CM | POA: Diagnosis not present

## 2015-06-23 DIAGNOSIS — N184 Chronic kidney disease, stage 4 (severe): Secondary | ICD-10-CM | POA: Diagnosis not present

## 2015-06-23 DIAGNOSIS — M25512 Pain in left shoulder: Secondary | ICD-10-CM | POA: Diagnosis not present

## 2015-06-23 DIAGNOSIS — N183 Chronic kidney disease, stage 3 (moderate): Secondary | ICD-10-CM | POA: Diagnosis not present

## 2015-06-23 NOTE — Patient Outreach (Signed)
[facility]) Care Management   06/23/2015  Dylan Williamson 1934/02/11 ED:[id number]  Dylan Williamson is an 81 y.o. male  Subjective: Initial home visit with pt, HIPAA verified, daughter present, pt states " I'm wearing heart monitor for 21 days"  Pt states he has prescription for xopenex to get filled but does not have a prescription for nebulizer machine.  Home health RN present and states they will be seeing pt at least weekly.  Pt states he has not been instructed to check CBG.  Pt states he does not weigh.    Objective:   Filed Vitals:   06/23/15 1854  BP: 110/60  Pulse: 88  Resp: 18  Height: 1.88 m (6\' 2" )  Weight: 198 lb (89.812 kg)  SpO2: 91%   ROS  Physical Exam  Constitutional: He appears well-developed and well-nourished.  HENT:  Head: Normocephalic.  Neck: Normal range of motion. Neck supple.  Cardiovascular: Normal rate.   Irregular rhythm  Respiratory: Effort normal.  Faint rhonchi auscultated bil upper posterior lobes  GI: Soft. Bowel sounds are normal.  Musculoskeletal: Normal range of motion. He exhibits edema.  1+ edema lower extremities bil    Encounter Medications:   Outpatient Encounter Prescriptions as of 06/23/2015  Medication Sig Note  . amoxicillin-clavulanate (AUGMENTIN) 875-125 MG tablet Take 1 tablet by mouth 2 (two) times daily.   . cholecalciferol (VITAMIN D) 1000 units tablet Take 1,000 Units by mouth daily.   Marland Kitchen diltiazem (CARDIZEM) 30 MG tablet Take 1 tablet (30 mg total) by mouth 2 (two) times daily.   . furosemide (LASIX) 40 MG tablet Take 1 tablet (40 mg total) by mouth daily.   Marland Kitchen gabapentin (NEURONTIN) 300 MG capsule Take 300 mg by mouth 2 (two) times daily.   Marland Kitchen glyBURIDE (DIABETA) 5 MG tablet Take 10 mg by mouth 2 (two) times daily with a meal.    . guaiFENesin (MUCINEX) 600 MG 12 hr tablet Take 1 tablet (600 mg total) by mouth 2 (two) times daily.   . Multiple Vitamins-Minerals (MULTIVITAMIN WITH MINERALS) tablet Take  1 tablet by mouth daily.   . nicotine (NICODERM CQ - DOSED IN MG/24 HOURS) 14 mg/24hr patch Place 14 mg onto the skin daily.   . Omega-3 Fatty Acids (FISH OIL) 1000 MG CAPS Take 1 capsule by mouth 2 (two) times daily.   . pramipexole (MIRAPEX) 1 MG tablet Take 1 mg by mouth at bedtime.   . rivaroxaban (XARELTO) 15 MG TABS tablet Take 1 tablet (15 mg total) by mouth daily with supper. (Patient taking differently: Take 20 mg by mouth daily with supper. )   . traZODone (DESYREL) 150 MG tablet Take 150 mg by mouth at bedtime.    . levalbuterol (XOPENEX) 0.63 MG/3ML nebulizer solution Take 3 mLs (0.63 mg total) by nebulization every 6 (six) hours. (Patient not taking: Reported on 06/23/2015) 06/23/2015: Pt does not have nebulizer machine   No facility-administered encounter medications on file as of 06/23/2015.    Functional Status:   In your present state of health, do you have any difficulty performing the following activities: 06/23/2015 06/17/2015  Hearing? N N  Vision? N N  Difficulty concentrating or making decisions? N N  Walking or climbing stairs? Y N  Dressing or bathing? N N  Doing errands, shopping? N N  Preparing Food and eating ? N -  Using the Toilet? N -  In the past six months, have you accidently leaked urine? Y -  Do you have  problems with loss of bowel control? N -  Managing your Medications? N -  Managing your Finances? N -  Housekeeping or managing your Housekeeping? N -    Fall/Depression Screening:   Depression screen [facility] 2/9 06/23/2015 06/10/2015  Decreased Interest 0 0  Down, Depressed, Hopeless 0 0  PHQ - 2 Score 0 0     Fall Risk  06/23/2015 06/10/2015  Falls in the past year? No No  Risk for fall due to : Impaired mobility Impaired vision    Assessment:  Pt lives alone and has good family support that assist him and spend the night.  Pt is very independent, looks after his own medications and wants it this way.  RN CM reviewed safety precautions as pt does  ambulate slower, has a can and does not use.  Pt quit smoking on 06/17/15, is to have CT scan in near future to rule out fluid versus scar tissue in lungs per daughter.  RN CM faxed letter to primary MD requesting prescription for nebulizer machine, faxed today's initial home visit, home health RN also reporting findings and plan of care to MD.  [facility] CM Care Plan Problem One        Most Recent Value   Care Plan Problem One  Pt high risk for hospital readmission related to disease processes (Atrial Fibrillation/ syncope)   Role Documenting the Problem One  Care Management [place] for Problem One  Active   THN Long Term Goal (31-90 days)  pt will verbalize better understanding of disease processes and have no hospital readmissions within 90 days   THN Long Term Goal Start Date  06/06/15   Interventions for Problem One Long Term Goal  RN CM gave pt 24 hour nurse line magnet, reviewed who to call and when, home health RN present and reviewed on call RN availability RN CM faxed letter to primary MD Dr. Nadara Mustard requesting prescription for nebulizer machine be faxed to [facility] or [place] CM Short Term Goal #1 (0-30 days)  pt will attend all upcoming MD appointments for month of June within 30 days.   THN CM Short Term Goal #1 Start Date  06/06/15   Interventions for Short Term Goal #1  RN CM reviewed upcoming appointments, CT scan to be scheduled and importance of attending all, take medicaitons to MD appointments   THN CM Short Term Goal #2 (0-30 days)  pt will verbalize medicaitons for atrial fibrillation within 30 days   THN CM Short Term Goal #2 Start Date  06/06/15   Interventions for Short Term Goal #2  RN CM reviewed all medications, purpose, dosage, side effects.    THN CM Care Plan Problem Two        Most Recent Value   Care Plan Problem Two  Knowledge deficit related to CHF   Role Documenting the Problem Two  Care Management Coordinator   Care Plan for  Problem Two  Active   THN CM Short Term Goal #1 (0-30 days)  pt will verbalize CHF zones/ action plan within 30 days.   THN CM Short Term Goal #1 Start Date  06/23/15   Interventions for Short Term Goal #2   RN CM gave pt copy of CHF zones/ action plan and reviewed with pt and daughter.   THN CM Short Term Goal #2 (0-30 days)  Pt will weigh daily and record within 30 days.   THN CM Short Term Goal #  2 Start Date  06/23/15   Interventions for Short Term Goal #2  RN CM ask pt to weigh daily and record in [facility] calendar, wrote out instructions for weighing.      Plan: follow up with home visit 07/22/15 Assess weight log Follow up on nebulizer machine  Jacqlyn Larsen [facility], [place] Coordinator [phone]

## 2015-06-26 DIAGNOSIS — I110 Hypertensive heart disease with heart failure: Secondary | ICD-10-CM | POA: Diagnosis not present

## 2015-06-26 DIAGNOSIS — J449 Chronic obstructive pulmonary disease, unspecified: Secondary | ICD-10-CM | POA: Diagnosis not present

## 2015-06-26 DIAGNOSIS — I509 Heart failure, unspecified: Secondary | ICD-10-CM | POA: Diagnosis not present

## 2015-06-26 DIAGNOSIS — N184 Chronic kidney disease, stage 4 (severe): Secondary | ICD-10-CM | POA: Diagnosis not present

## 2015-06-26 DIAGNOSIS — E1122 Type 2 diabetes mellitus with diabetic chronic kidney disease: Secondary | ICD-10-CM | POA: Diagnosis not present

## 2015-06-26 DIAGNOSIS — I4891 Unspecified atrial fibrillation: Secondary | ICD-10-CM | POA: Diagnosis not present

## 2015-06-27 DIAGNOSIS — I4891 Unspecified atrial fibrillation: Secondary | ICD-10-CM | POA: Diagnosis not present

## 2015-06-27 DIAGNOSIS — E1122 Type 2 diabetes mellitus with diabetic chronic kidney disease: Secondary | ICD-10-CM | POA: Diagnosis not present

## 2015-06-27 DIAGNOSIS — J449 Chronic obstructive pulmonary disease, unspecified: Secondary | ICD-10-CM | POA: Diagnosis not present

## 2015-06-27 DIAGNOSIS — I509 Heart failure, unspecified: Secondary | ICD-10-CM | POA: Diagnosis not present

## 2015-06-27 DIAGNOSIS — N184 Chronic kidney disease, stage 4 (severe): Secondary | ICD-10-CM | POA: Diagnosis not present

## 2015-06-27 DIAGNOSIS — I110 Hypertensive heart disease with heart failure: Secondary | ICD-10-CM | POA: Diagnosis not present

## 2015-06-30 ENCOUNTER — Other Ambulatory Visit: Payer: Self-pay | Admitting: *Deleted

## 2015-06-30 DIAGNOSIS — J449 Chronic obstructive pulmonary disease, unspecified: Secondary | ICD-10-CM | POA: Diagnosis not present

## 2015-06-30 DIAGNOSIS — N184 Chronic kidney disease, stage 4 (severe): Secondary | ICD-10-CM | POA: Diagnosis not present

## 2015-06-30 DIAGNOSIS — E1122 Type 2 diabetes mellitus with diabetic chronic kidney disease: Secondary | ICD-10-CM | POA: Diagnosis not present

## 2015-06-30 DIAGNOSIS — I4891 Unspecified atrial fibrillation: Secondary | ICD-10-CM | POA: Diagnosis not present

## 2015-06-30 DIAGNOSIS — I509 Heart failure, unspecified: Secondary | ICD-10-CM | POA: Diagnosis not present

## 2015-06-30 DIAGNOSIS — I110 Hypertensive heart disease with heart failure: Secondary | ICD-10-CM | POA: Diagnosis not present

## 2015-06-30 NOTE — Patient Outreach (Signed)
06/30/15- Telephone call to pt for transition of care week 2, permission given by pt to speak with son Louie Casa, HIPAA verified, per Louie Casa, pt is doing well today, home health continues to see pt,  Pt now using nebulizer machine as prescribed.  No medication changes reported.  Weight today 196 pounds.  Pt to see Dr. Harl Bowie 07/03/15.  THN CM Care Plan Problem One        Most Recent Value   Care Plan Problem One  Pt high risk for hospital readmission related to disease processes (Atrial Fibrillation/ syncope)   Role Documenting the Problem One  Care Management [place] for Problem One  Active   THN Long Term Goal (31-90 days)  pt will verbalize better understanding of disease processes and have no hospital readmissions within 90 days   THN Long Term Goal Start Date  06/06/15   Interventions for Problem One Long Term Goal  RN CM talked with son Louie Casa about nebulizer machine, pt does now have this and using as prescribed.   THN CM Short Term Goal #1 (0-30 days)  pt will attend all upcoming MD appointments for month of June within 30 days.   THN CM Short Term Goal #1 Start Date  06/06/15   Interventions for Short Term Goal #1  RN CM reveiwed upcoming appointment this week 6/29 with Dr. Harl Bowie   [facility] CM Short Term Goal #2 (0-30 days)  pt will verbalize medicaitons for atrial fibrillation within 30 days   THN CM Short Term Goal #2 Start Date  06/06/15   Interventions for Short Term Goal #2  Rn CM reviewed with son Louie Casa no medication changes since last conversation    [facility] CM Care Plan Problem Two        Most Recent Value   Care Plan Problem Two  Knowledge deficit related to CHF   Role Documenting the Problem Two  Care Management Coordinator   Care Plan for Problem Two  Active   THN CM Short Term Goal #1 (0-30 days)  pt will verbalize CHF zones/ action plan within 30 days.   THN CM Short Term Goal #1 Start Date  06/23/15   Interventions for Short Term Goal #2   RN CM reminded son Louie Casa of  importance of daily weights,  pt is weighing daily      PLAN Follow up with weekly transition of care calls  Jacqlyn Larsen [facility], [place] Coordinator [phone]

## 2015-07-01 DIAGNOSIS — I4891 Unspecified atrial fibrillation: Secondary | ICD-10-CM | POA: Diagnosis not present

## 2015-07-01 DIAGNOSIS — I110 Hypertensive heart disease with heart failure: Secondary | ICD-10-CM | POA: Diagnosis not present

## 2015-07-01 DIAGNOSIS — J449 Chronic obstructive pulmonary disease, unspecified: Secondary | ICD-10-CM | POA: Diagnosis not present

## 2015-07-01 DIAGNOSIS — I509 Heart failure, unspecified: Secondary | ICD-10-CM | POA: Diagnosis not present

## 2015-07-01 DIAGNOSIS — N184 Chronic kidney disease, stage 4 (severe): Secondary | ICD-10-CM | POA: Diagnosis not present

## 2015-07-01 DIAGNOSIS — E1122 Type 2 diabetes mellitus with diabetic chronic kidney disease: Secondary | ICD-10-CM | POA: Diagnosis not present

## 2015-07-02 ENCOUNTER — Encounter: Payer: Self-pay | Admitting: *Deleted

## 2015-07-02 DIAGNOSIS — I110 Hypertensive heart disease with heart failure: Secondary | ICD-10-CM | POA: Diagnosis not present

## 2015-07-02 DIAGNOSIS — I4891 Unspecified atrial fibrillation: Secondary | ICD-10-CM | POA: Diagnosis not present

## 2015-07-02 DIAGNOSIS — N184 Chronic kidney disease, stage 4 (severe): Secondary | ICD-10-CM | POA: Diagnosis not present

## 2015-07-02 DIAGNOSIS — E1122 Type 2 diabetes mellitus with diabetic chronic kidney disease: Secondary | ICD-10-CM | POA: Diagnosis not present

## 2015-07-02 DIAGNOSIS — I509 Heart failure, unspecified: Secondary | ICD-10-CM | POA: Diagnosis not present

## 2015-07-02 DIAGNOSIS — J449 Chronic obstructive pulmonary disease, unspecified: Secondary | ICD-10-CM | POA: Diagnosis not present

## 2015-07-03 ENCOUNTER — Ambulatory Visit (INDEPENDENT_AMBULATORY_CARE_PROVIDER_SITE_OTHER): Payer: Medicare Other | Admitting: Cardiology

## 2015-07-03 ENCOUNTER — Encounter: Payer: Self-pay | Admitting: Cardiology

## 2015-07-03 VITALS — BP 91/56 | HR 87 | Ht 74.0 in | Wt 189.0 lb

## 2015-07-03 DIAGNOSIS — I4819 Other persistent atrial fibrillation: Secondary | ICD-10-CM

## 2015-07-03 DIAGNOSIS — I481 Persistent atrial fibrillation: Secondary | ICD-10-CM

## 2015-07-03 DIAGNOSIS — I10 Essential (primary) hypertension: Secondary | ICD-10-CM

## 2015-07-03 MED ORDER — AMIODARONE HCL 200 MG PO TABS: 200.0000 mg | ORAL_TABLET | Freq: Two times a day (BID) | ORAL | Status: DC

## 2015-07-03 MED ORDER — LEVALBUTEROL HCL 0.63 MG/3ML IN NEBU: 0.6300 mg | INHALATION_SOLUTION | Freq: Four times a day (QID) | RESPIRATORY_TRACT | Status: DC

## 2015-07-03 NOTE — Progress Notes (Signed)
Clinical Summary Mr. Dylan Williamson is a 81 y.o.male seen today for follow up of the following medical problems.   1. Afib  - new diagnosis 06/2015 during hospital admission - started on eliquis, patient felt it was causing some lightheadness and stopped taking. Was started on xarelto and seems to be tolerating well.  - 3 week monitor at discharge showed episodes of afib with RVR.  - deneis any palpitations since last visit.      Past Medical History  Diagnosis Date  . Neuropathy ([place])   . Diabetes mellitus without complication ([place])   . Hypertension   . Acute renal failure (HCC)      No Known Allergies   Current Outpatient Prescriptions  Medication Sig Dispense Refill  . amoxicillin-clavulanate (AUGMENTIN) 875-125 MG tablet Take 1 tablet by mouth 2 (two) times daily. 4 tablet 0  . cholecalciferol (VITAMIN D) 1000 units tablet Take 1,000 Units by mouth daily.    Marland Kitchen diltiazem (CARDIZEM) 30 MG tablet Take 1 tablet (30 mg total) by mouth 2 (two) times daily. 60 tablet 0  . furosemide (LASIX) 40 MG tablet Take 1 tablet (40 mg total) by mouth daily. 30 tablet 0  . gabapentin (NEURONTIN) 300 MG capsule Take 300 mg by mouth 2 (two) times daily.    Marland Kitchen glyBURIDE (DIABETA) 5 MG tablet Take 10 mg by mouth 2 (two) times daily with a meal.     . guaiFENesin (MUCINEX) 600 MG 12 hr tablet Take 1 tablet (600 mg total) by mouth 2 (two) times daily. 30 tablet 0  . levalbuterol (XOPENEX) 0.63 MG/3ML nebulizer solution Take 3 mLs (0.63 mg total) by nebulization every 6 (six) hours. (Patient not taking: Reported on 06/23/2015) 3 mL 12  . Multiple Vitamins-Minerals (MULTIVITAMIN WITH MINERALS) tablet Take 1 tablet by mouth daily.    . nicotine (NICODERM CQ - DOSED IN MG/24 HOURS) 14 mg/24hr patch Place 14 mg onto the skin daily.    . Omega-3 Fatty Acids (FISH OIL) 1000 MG CAPS Take 1 capsule by mouth 2 (two) times daily.    . pramipexole (MIRAPEX) 1 MG tablet Take 1 mg by mouth at bedtime.    .  rivaroxaban (XARELTO) 15 MG TABS tablet Take 1 tablet (15 mg total) by mouth daily with supper. (Patient taking differently: Take 20 mg by mouth daily with supper. ) 30 tablet 1  . traZODone (DESYREL) 150 MG tablet Take 150 mg by mouth at bedtime.      No current facility-administered medications for this visit.     No past surgical history on file.   No Known Allergies    Family History  Problem Relation Age of Onset  . Family history unknown: Yes     Social History Mr. Dylan Williamson reports that he quit smoking about 4 weeks ago. His smoking use included Cigarettes. He smoked 1.00 pack per day. He does not have any smokeless tobacco history on file. Mr. Dylan Williamson reports that he drinks alcohol.   Review of Systems CONSTITUTIONAL: No weight loss, fever, chills, weakness or fatigue.  HEENT: Eyes: No visual loss, blurred vision, double vision or yellow sclerae.No hearing loss, sneezing, congestion, runny nose or sore throat.  SKIN: No rash or itching.  CARDIOVASCULAR: per HPI RESPIRATORY: No SOB, no cough  GASTROINTESTINAL: No anorexia, nausea, vomiting or diarrhea. No abdominal pain or blood.  GENITOURINARY: No burning on urination, no polyuria NEUROLOGICAL: No headache, dizziness, syncope, paralysis, ataxia, numbness or tingling in the extremities. No change in  bowel or bladder control.  MUSCULOSKELETAL: No muscle, back pain, joint pain or stiffness.  LYMPHATICS: No enlarged nodes. No history of splenectomy.  PSYCHIATRIC: No history of depression or anxiety.  ENDOCRINOLOGIC: No reports of sweating, cold or heat intolerance. No polyuria or polydipsia.  Marland Kitchen   Physical Examination Filed Vitals:   07/03/15 1454 07/03/15 1457  BP: 91/57 91/56  Pulse: 79 87   Filed Vitals:   07/03/15 1438  Height: 6\' 2"  (1.88 m)  Weight: 189 lb (85.73 kg)    Gen: resting comfortably, no acute distress HEENT: no scleral icterus, pupils equal round and reactive, no palptable cervical  adenopathy,  CV: irreg, no m/r/g, no jvd Resp: Clear to auscultation bilaterally GI: abdomen is soft, non-tender, non-distended, normal bowel sounds, no hepatosplenomegaly MSK: extremities are warm, no edema.  Skin: warm, no rash Neuro:  no focal deficits Psych: appropriate affect     Assessment and Plan   1. Afib - recent monitor showed episodes of afib with RVR despite being on diltiazem at home - soft bp's, no room to titrate av nodal agents. We will start amiodarone 200mg  bid and stop diltiazem. F/u 1 month, if remains in afib consider trial of DCCV.  - continue xarelto. He will need to decrease from 20mg  to 15mg  daily given his poor renal function.   2. HTN - soft bp's, we will stop dilt as described above.   Arnoldo Lenis, M.D.

## 2015-07-03 NOTE — Patient Instructions (Signed)
Your physician recommends that you schedule a follow-up appointment in: [place] [address] [place]  Your physician has recommended you make the following change in your medication:   START AMIODARONE 200 MG TWICE DAILY  STOP TAKING DILTIAZEM   Thank you for choosing [place]!!

## 2015-07-04 DIAGNOSIS — J449 Chronic obstructive pulmonary disease, unspecified: Secondary | ICD-10-CM | POA: Diagnosis not present

## 2015-07-04 DIAGNOSIS — N184 Chronic kidney disease, stage 4 (severe): Secondary | ICD-10-CM | POA: Diagnosis not present

## 2015-07-04 DIAGNOSIS — E1122 Type 2 diabetes mellitus with diabetic chronic kidney disease: Secondary | ICD-10-CM | POA: Diagnosis not present

## 2015-07-04 DIAGNOSIS — I4891 Unspecified atrial fibrillation: Secondary | ICD-10-CM | POA: Diagnosis not present

## 2015-07-04 DIAGNOSIS — I110 Hypertensive heart disease with heart failure: Secondary | ICD-10-CM | POA: Diagnosis not present

## 2015-07-04 DIAGNOSIS — I509 Heart failure, unspecified: Secondary | ICD-10-CM | POA: Diagnosis not present

## 2015-07-07 ENCOUNTER — Other Ambulatory Visit: Payer: Self-pay

## 2015-07-07 ENCOUNTER — Telehealth: Payer: Self-pay | Admitting: *Deleted

## 2015-07-07 DIAGNOSIS — N184 Chronic kidney disease, stage 4 (severe): Secondary | ICD-10-CM | POA: Diagnosis not present

## 2015-07-07 DIAGNOSIS — E1122 Type 2 diabetes mellitus with diabetic chronic kidney disease: Secondary | ICD-10-CM | POA: Diagnosis not present

## 2015-07-07 DIAGNOSIS — I509 Heart failure, unspecified: Secondary | ICD-10-CM | POA: Diagnosis not present

## 2015-07-07 DIAGNOSIS — I110 Hypertensive heart disease with heart failure: Secondary | ICD-10-CM | POA: Diagnosis not present

## 2015-07-07 DIAGNOSIS — J449 Chronic obstructive pulmonary disease, unspecified: Secondary | ICD-10-CM | POA: Diagnosis not present

## 2015-07-07 DIAGNOSIS — I4891 Unspecified atrial fibrillation: Secondary | ICD-10-CM | POA: Diagnosis not present

## 2015-07-07 MED ORDER — RIVAROXABAN 15 MG PO TABS: 15.0000 mg | ORAL_TABLET | Freq: Every day | ORAL | Status: DC

## 2015-07-07 NOTE — Patient Outreach (Signed)
I called Dylan Williamson to follow up on how well he was tolerating Xarelto.  I had to leave a HIPPA compliant message for the patient to call me back.  If I do not hear back from the patient today, I will call the patient back at a later date. Per Dr. Nelly Laurence note, he seems to be tolerating it well.  Since he does not have a Part D plan, I will begin the process for applying for patient assistance.  I will inform him of the process once I hear back from him.   Deanne Coffer, PharmD, [place] [phone]

## 2015-07-07 NOTE — Telephone Encounter (Signed)
Pt aware and will come by office to pick up 15 mg Xarelto samples.

## 2015-07-07 NOTE — Patient Outreach (Signed)
Dylan Williamson returned my phone call.  He stated he is tolerating Xarelto very well.  He stated he has 70 days worth of it. I asked if he would like to start the process of patient assistance from the pharmaceutical company.  He stated he would.  He asked me to mail him the paperwork.  I will complete as much of the paperwork as I can.  I will send it to him to complete and send back to me.  I will also send the form for the physician to sign as well.  I will follow up with Dylan Williamson in the next couple of weeks if I do not receive his application.    Deanne Coffer, PharmD, [place] [phone]

## 2015-07-07 NOTE — Telephone Encounter (Signed)
-----   Message from Arnoldo Lenis, MD sent at 07/05/2015  6:49 PM EDT ----- On second thought we do need to change this patient's home samples of xarelto to 15mg  daily. You had asked me during his visit. Can you contact him and let him know.  Zandra Abts MD

## 2015-07-08 DIAGNOSIS — N184 Chronic kidney disease, stage 4 (severe): Secondary | ICD-10-CM | POA: Diagnosis not present

## 2015-07-08 DIAGNOSIS — I509 Heart failure, unspecified: Secondary | ICD-10-CM | POA: Diagnosis not present

## 2015-07-08 DIAGNOSIS — J449 Chronic obstructive pulmonary disease, unspecified: Secondary | ICD-10-CM | POA: Diagnosis not present

## 2015-07-08 DIAGNOSIS — I4891 Unspecified atrial fibrillation: Secondary | ICD-10-CM | POA: Diagnosis not present

## 2015-07-08 DIAGNOSIS — I110 Hypertensive heart disease with heart failure: Secondary | ICD-10-CM | POA: Diagnosis not present

## 2015-07-08 DIAGNOSIS — E1122 Type 2 diabetes mellitus with diabetic chronic kidney disease: Secondary | ICD-10-CM | POA: Diagnosis not present

## 2015-07-09 ENCOUNTER — Other Ambulatory Visit: Payer: Self-pay | Admitting: *Deleted

## 2015-07-09 NOTE — Patient Outreach (Signed)
07/09/15- Telephone call for transition of care week 3, spoke with pt, HIPAA verified, pt reports he saw Dr. Harl Bowie on 07/03/15 and "got free samples for blood thinner xarelto"  Pt reports he has all medications and taking as prescribed, home health continues, weight 191 pounds and pt states " I've been losing weight since I started the nebulizer medicine, it makes me have a bad taste in my mouth"  Pt reports he is rinsing his mouth and using mouthwash after each use.  Pt reports he is feeling stronger and working with home health.  RN CM sent In Basket to pharmacy with question of side effect of bad taste in mouth after using nebulizer medication.  THN CM Care Plan Problem One        Most Recent Value   Care Plan Problem One  Pt high risk for hospital readmission related to disease processes (Atrial Fibrillation/ syncope)   Role Documenting the Problem One  Care Management [place] for Problem One  Active   THN Long Term Goal (31-90 days)  pt will verbalize better understanding of disease processes and have no hospital readmissions within 90 days   THN Long Term Goal Start Date  06/06/15   Interventions for Problem One Long Term Goal  RN CM discussed pt seeing Dr. Harl Bowie 6/29, pt now has samples eliquis   THN CM Short Term Goal #1 (0-30 days)  pt will attend all upcoming MD appointments for month of June within 30 days.   THN CM Short Term Goal #1 Start Date  06/06/15   [facility] CM Short Term Goal #1 Met Date  07/09/15   Interventions for Short Term Goal #1  RN CM reviewed appointment pt had with Dr. Harl Bowie 07/03/15   THN CM Short Term Goal #2 (0-30 days)  pt will verbalize medicaitons for atrial fibrillation within 30 days   THN CM Short Term Goal #2 Start Date  06/06/15   Interventions for Short Term Goal #2  RN CM reviewed signs/ symptoms Atrial Fibrillation    [facility] CM Care Plan Problem Two        Most Recent Value   Care Plan Problem Two  Knowledge deficit related to CHF   Role  Documenting the Problem Two  Care Management Coordinator   Care Plan for Problem Two  Active   THN CM Short Term Goal #1 (0-30 days)  pt will verbalize CHF zones/ action plan within 30 days.   THN CM Short Term Goal #1 Start Date  06/23/15   Interventions for Short Term Goal #2   RN CM reviewed CHF zones/ action plan      PLAN Continue weekly transition of care  Jacqlyn Larsen [facility], [place] Coordinator [phone]

## 2015-07-10 DIAGNOSIS — I509 Heart failure, unspecified: Secondary | ICD-10-CM | POA: Diagnosis not present

## 2015-07-10 DIAGNOSIS — E1122 Type 2 diabetes mellitus with diabetic chronic kidney disease: Secondary | ICD-10-CM | POA: Diagnosis not present

## 2015-07-10 DIAGNOSIS — N184 Chronic kidney disease, stage 4 (severe): Secondary | ICD-10-CM | POA: Diagnosis not present

## 2015-07-10 DIAGNOSIS — I4891 Unspecified atrial fibrillation: Secondary | ICD-10-CM | POA: Diagnosis not present

## 2015-07-10 DIAGNOSIS — J449 Chronic obstructive pulmonary disease, unspecified: Secondary | ICD-10-CM | POA: Diagnosis not present

## 2015-07-10 DIAGNOSIS — I110 Hypertensive heart disease with heart failure: Secondary | ICD-10-CM | POA: Diagnosis not present

## 2015-07-11 DIAGNOSIS — J449 Chronic obstructive pulmonary disease, unspecified: Secondary | ICD-10-CM | POA: Diagnosis not present

## 2015-07-11 DIAGNOSIS — E1122 Type 2 diabetes mellitus with diabetic chronic kidney disease: Secondary | ICD-10-CM | POA: Diagnosis not present

## 2015-07-11 DIAGNOSIS — I110 Hypertensive heart disease with heart failure: Secondary | ICD-10-CM | POA: Diagnosis not present

## 2015-07-11 DIAGNOSIS — I4891 Unspecified atrial fibrillation: Secondary | ICD-10-CM | POA: Diagnosis not present

## 2015-07-11 DIAGNOSIS — I509 Heart failure, unspecified: Secondary | ICD-10-CM | POA: Diagnosis not present

## 2015-07-11 DIAGNOSIS — N184 Chronic kidney disease, stage 4 (severe): Secondary | ICD-10-CM | POA: Diagnosis not present

## 2015-07-14 ENCOUNTER — Other Ambulatory Visit: Payer: Self-pay | Admitting: *Deleted

## 2015-07-14 ENCOUNTER — Other Ambulatory Visit: Payer: Self-pay | Admitting: Pharmacist

## 2015-07-14 DIAGNOSIS — I509 Heart failure, unspecified: Secondary | ICD-10-CM | POA: Diagnosis not present

## 2015-07-14 DIAGNOSIS — I110 Hypertensive heart disease with heart failure: Secondary | ICD-10-CM | POA: Diagnosis not present

## 2015-07-14 DIAGNOSIS — E1122 Type 2 diabetes mellitus with diabetic chronic kidney disease: Secondary | ICD-10-CM | POA: Diagnosis not present

## 2015-07-14 DIAGNOSIS — N184 Chronic kidney disease, stage 4 (severe): Secondary | ICD-10-CM | POA: Diagnosis not present

## 2015-07-14 DIAGNOSIS — J449 Chronic obstructive pulmonary disease, unspecified: Secondary | ICD-10-CM | POA: Diagnosis not present

## 2015-07-14 DIAGNOSIS — I4891 Unspecified atrial fibrillation: Secondary | ICD-10-CM | POA: Diagnosis not present

## 2015-07-14 NOTE — Patient Outreach (Signed)
Call to follow up with patient's Cardiologist, Dr. Harl Bowie, to confirm whether the patient should be continuing to take lisinopril and diltiazem. Speak with Marzetta Board in Dr. Nelly Laurence office, Let Marzetta Board know that per patient's son, Mr. Hanthorn is currently taking dilitazem and lisinopril. Note that per patient's hospital discharge summary from 06/20/15, patient was instructed to discontinue lisinopril at discharge. Also note that per note in EPIC from patient's cardiologist from 07/03/15, patient was instructed to "start amiodarone 200mg  bid and stop diltiazem".   Marzetta Board reports that the patient was to have discontinued both the diltiazem and lisinopril. I ask that she call the patient to let him know to discontinue these medications and ask that she review his medication list with him in full to ensure that he is confident about which medications he is to take. Marzetta Board states that she will contact the patient to have this discussion.   Harlow Asa, PharmD Clinical Pharmacist [place] Management [phone]

## 2015-07-14 NOTE — Patient Outreach (Signed)
07/14/15- Telephone call to pt for transition of care week 4, no answer to telephone and no option to leave voicemail, called Rueben Bash (daughter) cell at [phone] and no answer, no option to leave voicemail.  PLAN See pt for scheduled home visit 07/22/15  Jacqlyn Larsen [facility], [place] Coordinator [phone]

## 2015-07-14 NOTE — Addendum Note (Signed)
Addended by: Harlow Asa A on: 07/14/2015 11:01 AM   Modules accepted: Medications

## 2015-07-14 NOTE — Patient Outreach (Addendum)
Receive a message from Nurse Care Manager Jacqlyn Larsen that patient has a medication question about a bad taste in his mouth related to his nebulizer solution. Left a HIPAA compliant message on the patient's voicemail.   Receive a call back from Dylan Williamson and his son. HIPAA identifiers verified and verbal consent received. Dylan Williamson asks that I speak with his son Dylan Williamson, as he is having trouble hearing me through the phone with his hearing aids. Dylan Williamson reports that his father has been having an ongoing "bad taste" in his mouth ever since he started on the levalbuterol nebulizer solution. Reports that the bad taste has been constant since it started, not waxing or waning. Reports that nothing, including rinsing his mouth, has improved the symptom, Denies the patient having started on any other medications around the same time. Per the package labeling of this product, do not note this to be a reported side effect from clinical trial or post-marketing data. However, offer to contact patient's PCP, Dr. Nadara Mustard, about the possibility of trying the patient on albuterol nebulizer solution as an alternative to see if the levalbuterol solution is the cause.   While on the phone with the patient and his son, also review patient's current medication list. Among these medications, [place] reports that Dylan Williamson is still taking dilitazem and lisinopril. Note that per patient's hospital discharge summary from 06/20/15, patient was instructed to discontinue lisinopril at discharge. Also note that per note in EPIC from patient's cardiologist from 07/03/15, patient was instructed to "start amiodarone 200mg  bid and stop diltiazem".   PLAN:  1) Will follow up with patient's Cardiologist, Dr. Harl Bowie, to confirm whether the patient was to have stopped the lisinopril and diltiazem.  2) Will follow up with patient's PCP, Dr. Nadara Mustard, to discuss trying the patient on albuterol nebulizer solution as an alternative to  see if the levalbuterol solution is the cause of the patient's ongoing bad taste in his mouth.   Harlow Asa, PharmD Clinical Pharmacist [place] Management [phone]

## 2015-07-14 NOTE — Patient Outreach (Signed)
Call patient's PCP, Dr. Nadara Mustard [phone]), to discuss trying the patient on albuterol nebulizer solution as an alternative to see if the levalbuterol solution is the cause of the patient's ongoing bad taste in his mouth. Leave a message with Delynn Flavin in Dr. Lyman Speller office. If have not heard back by 07/16/15, will follow up with Dr, Lyman Speller office again at that time.  Harlow Asa, PharmD Clinical Pharmacist [place] Management [phone]

## 2015-07-15 ENCOUNTER — Telehealth: Payer: Self-pay | Admitting: *Deleted

## 2015-07-15 DIAGNOSIS — N184 Chronic kidney disease, stage 4 (severe): Secondary | ICD-10-CM | POA: Diagnosis not present

## 2015-07-15 DIAGNOSIS — I509 Heart failure, unspecified: Secondary | ICD-10-CM | POA: Diagnosis not present

## 2015-07-15 DIAGNOSIS — I110 Hypertensive heart disease with heart failure: Secondary | ICD-10-CM | POA: Diagnosis not present

## 2015-07-15 DIAGNOSIS — I4891 Unspecified atrial fibrillation: Secondary | ICD-10-CM | POA: Diagnosis not present

## 2015-07-15 DIAGNOSIS — E1122 Type 2 diabetes mellitus with diabetic chronic kidney disease: Secondary | ICD-10-CM | POA: Diagnosis not present

## 2015-07-15 DIAGNOSIS — J449 Chronic obstructive pulmonary disease, unspecified: Secondary | ICD-10-CM | POA: Diagnosis not present

## 2015-07-15 NOTE — Telephone Encounter (Signed)
Nurse from [facility] requesting to go over medication changes with pt 07/14/15- LM for pt - spoke with pt this AM and verified he was to stop dilt and start amiodarone per LOV. Pt also taking Xarelto 15 mg. Pt voiced understanding of medications and will call for any further questions.

## 2015-07-16 ENCOUNTER — Other Ambulatory Visit: Payer: Self-pay | Admitting: Pharmacist

## 2015-07-16 ENCOUNTER — Ambulatory Visit: Payer: Medicare Other | Admitting: Pharmacist

## 2015-07-16 DIAGNOSIS — E1122 Type 2 diabetes mellitus with diabetic chronic kidney disease: Secondary | ICD-10-CM | POA: Diagnosis not present

## 2015-07-16 DIAGNOSIS — J449 Chronic obstructive pulmonary disease, unspecified: Secondary | ICD-10-CM | POA: Diagnosis not present

## 2015-07-16 DIAGNOSIS — I509 Heart failure, unspecified: Secondary | ICD-10-CM | POA: Diagnosis not present

## 2015-07-16 DIAGNOSIS — I4891 Unspecified atrial fibrillation: Secondary | ICD-10-CM | POA: Diagnosis not present

## 2015-07-16 DIAGNOSIS — I110 Hypertensive heart disease with heart failure: Secondary | ICD-10-CM | POA: Diagnosis not present

## 2015-07-16 DIAGNOSIS — N184 Chronic kidney disease, stage 4 (severe): Secondary | ICD-10-CM | POA: Diagnosis not present

## 2015-07-16 NOTE — Patient Outreach (Signed)
Call and speak with Jerene Pitch in Dr. Lyman Speller office. Jerene Pitch reports that in response to my message from 07/14/15 regarding trying the patient on albuterol nebulizer solution as an alternative to see if the levalbuterol solution is the cause of the patient's ongoing bad taste in his mouth, she called and spoke with the patient.   Reports that the patient stated that he had not yet tried rinsing his mouth after nebulizer use. Let Jerene Pitch know that I had spoken with the patient's son who stated that he had been having the patient rinse with water and with mouthwash, but that nothing was relieving the bad taste. Jerene Pitch stated that she would follow up again with the provider and then call me back.  If have not heard back from Dr. Lyman Speller office by 07/18/15, will follow up again at that time.  Harlow Asa, PharmD Clinical Pharmacist [place] Management [phone]

## 2015-07-17 ENCOUNTER — Telehealth: Payer: Self-pay | Admitting: Cardiology

## 2015-07-17 DIAGNOSIS — K921 Melena: Secondary | ICD-10-CM

## 2015-07-17 DIAGNOSIS — N289 Disorder of kidney and ureter, unspecified: Secondary | ICD-10-CM | POA: Diagnosis not present

## 2015-07-17 DIAGNOSIS — Z5181 Encounter for therapeutic drug level monitoring: Secondary | ICD-10-CM

## 2015-07-17 DIAGNOSIS — Z7901 Long term (current) use of anticoagulants: Secondary | ICD-10-CM | POA: Diagnosis not present

## 2015-07-17 DIAGNOSIS — I5031 Acute diastolic (congestive) heart failure: Secondary | ICD-10-CM

## 2015-07-17 DIAGNOSIS — I4891 Unspecified atrial fibrillation: Secondary | ICD-10-CM

## 2015-07-17 LAB — PROTIME-INR

## 2015-07-17 NOTE — Telephone Encounter (Signed)
Patient called about having bright red blood in his stool.  It has happened this past Sunday night and last night

## 2015-07-17 NOTE — Telephone Encounter (Signed)
Have him stop xarelto, please order cbc, INR, BMET and refer to GI please. Has he had any lightheadness, dizziness, or SOB. If bleeding does not stop by tomorrow  he will need to go to ER. Ask him to get labs today.   Zandra Abts MD

## 2015-07-17 NOTE — Telephone Encounter (Signed)
Spoke with patient and and informed him of the below information. Patient verbalized understanding of plan. Per patient, no c/o lightheaded, dizziness or sob. Lab work order faxed to [facility] lab and patient is awaiting callback with GI appt information.

## 2015-07-18 ENCOUNTER — Ambulatory Visit: Payer: Medicare Other | Admitting: Gastroenterology

## 2015-07-18 ENCOUNTER — Other Ambulatory Visit: Payer: Self-pay | Admitting: Pharmacist

## 2015-07-18 NOTE — Patient Outreach (Signed)
Receive a phone call back from Williamson [phone]), pharmacist with [employer]. In response to my inquiry about dysgeusia with levalbuterol nebulizer solution. Gwenette Greet states that "bad taste" has been reported with this medication in about 1% of patients. Advises that the patient rinse his mouth after each use. Reports that the side effect typically lessens over time.  Harlow Asa, PharmD Clinical Pharmacist [facility] [phone]

## 2015-07-18 NOTE — Patient Outreach (Addendum)
Receive a voicemail from Hall in Dr. Lyman Speller office regarding my message about trying the patient on albuterol nebulizer solution as an alternative, to see if the levalbuterol solution is the cause of the patient's ongoing bad taste in his mouth.  Call back and speak with Megan in the office who reports that per Dr. Nadara Mustard it is fine to try the patient on albuterol nebulizer solution instead. Jinny Blossom reports that she will call to follow up with the patient and then call this into the patient's pharmacy.   Harlow Asa, PharmD Clinical Pharmacist [facility] [phone]

## 2015-07-22 ENCOUNTER — Ambulatory Visit: Payer: Medicare Other | Admitting: Gastroenterology

## 2015-07-22 ENCOUNTER — Other Ambulatory Visit: Payer: Self-pay | Admitting: *Deleted

## 2015-07-22 ENCOUNTER — Encounter: Payer: Self-pay | Admitting: *Deleted

## 2015-07-22 NOTE — Patient Outreach (Signed)
[facility]) Care Management   07/22/2015  Larenzo Cittadino [birth year] ED:[id number]  Dylan Williamson is an 81 y.o. male  Subjective: Routine home visit, HIPAA verified, pt reports he is to see Dr. Nadara Mustard tomorrow (primary MD) and will discuss weight loss and "bad taste in my mouth I think is from nebulizer medicine" Pt states " I do think part of my weight loss has been fluid also"  Pt reports he continues weighing daily, home health continues to work with pt.  Pt to see cardiologist on 08/05/15 and pt states " he'll decide if I'm to go back on blood thinner because they stopped it because of rectal bleeding which has now stopped"  Pt states he has had no syncopal episodes.  Objective:   Filed Vitals:   07/22/15 1812  BP: 108/64  Pulse: 84  Resp: 18  Weight: 183 lb 9.6 oz (83.28 kg)  SpO2: 95%   ROS  Physical Exam  Constitutional: He is oriented to person, place, and time. He appears well-developed and well-nourished.  HENT:  Head: Normocephalic.  Neck: Normal range of motion. Neck supple.  Cardiovascular: Normal rate.   Irregular rhythm  Respiratory: Effort normal and breath sounds normal.  GI: Soft. Bowel sounds are normal.  Musculoskeletal: Normal range of motion.  Dependent edema lower extremities bil  Neurological: He is alert and oriented to person, place, and time.  Skin: Skin is warm and dry.  Psychiatric: He has a normal mood and affect. His behavior is normal. Judgment and thought content normal.    Encounter Medications:   Outpatient Encounter Prescriptions as of 07/22/2015  Medication Sig Note  . amiodarone (PACERONE) 200 MG tablet Take 1 tablet (200 mg total) by mouth 2 (two) times daily.   . cholecalciferol (VITAMIN D) 1000 units tablet Take 1,000 Units by mouth daily.   . furosemide (LASIX) 40 MG tablet Take 1 tablet (40 mg total) by mouth daily.   Marland Kitchen gabapentin (NEURONTIN) 300 MG capsule Take 300 mg by mouth 2 (two) times daily.   Marland Kitchen glyBURIDE  (DIABETA) 5 MG tablet Take 10 mg by mouth 2 (two) times daily with a meal.    . guaiFENesin (MUCINEX) 600 MG 12 hr tablet Take 1 tablet (600 mg total) by mouth 2 (two) times daily.   Marland Kitchen levalbuterol (XOPENEX) 0.63 MG/3ML nebulizer solution Take 3 mLs (0.63 mg total) by nebulization every 6 (six) hours.   . Multiple Vitamins-Minerals (MULTIVITAMIN WITH MINERALS) tablet Take 1 tablet by mouth daily.   . pramipexole (MIRAPEX) 1 MG tablet Take 1 mg by mouth at bedtime.   . traZODone (DESYREL) 150 MG tablet Take 150 mg by mouth at bedtime.    . nicotine (NICODERM CQ - DOSED IN MG/24 HOURS) 14 mg/24hr patch Place 14 mg onto the skin daily. Reported on 07/22/2015   . Rivaroxaban (XARELTO) 15 MG TABS tablet Take 1 tablet (15 mg total) by mouth daily with supper. (Patient not taking: Reported on 07/22/2015) 07/22/2015: Pt reports he was instructed not to take until sees cardiologist on 08/05/15 (due to rectal bleeding)   No facility-administered encounter medications on file as of 07/22/2015.    Functional Status:   In your present state of health, do you have any difficulty performing the following activities: 06/23/2015 06/17/2015  Hearing? N N  Vision? N N  Difficulty concentrating or making decisions? N N  Walking or climbing stairs? Y N  Dressing or bathing? N N  Doing errands, shopping? N N  Preparing  Food and eating ? N -  Using the Toilet? N -  In the past six months, have you accidently leaked urine? Y -  Do you have problems with loss of bowel control? N -  Managing your Medications? N -  Managing your Finances? N -  Housekeeping or managing your Housekeeping? N -    Fall/Depression Screening:    PHQ 2/9 Scores 06/23/2015 06/10/2015  PHQ - 2 Score 0 0    Assessment:  Pt continues doing well with smoking cessation and is no longer needing to wear nicotine patch.  Pt has good family support.  Pt is trying to walk some daily, pt is attending all MD appointments.  RN CM reviewed MD notes and pt is  to stop xarelto until further advised by MD.  [facility] CM Care Plan Problem One        Most Recent Value   Care Plan Problem One  Pt high risk for hospital readmission related to disease processes (Atrial Fibrillation/ syncope)   Role Documenting the Problem One  Care Management [place] for Problem One  Active   THN Long Term Goal (31-90 days)  pt will verbalize better understanding of disease processes and have no hospital readmissions within 90 days   THN Long Term Goal Start Date  06/06/15   Interventions for Problem One Long Term Goal  RN CM reviewed medications, pt denies any syncopal episodes   THN CM Short Term Goal #2 (0-30 days)  pt will verbalize medicaitons for atrial fibrillation within 30 days   THN CM Short Term Goal #2 Start Date  07/22/15 [goal restarted]   Interventions for Short Term Goal #2  RN CM reiterated signs/ symptoms atrial fibrillation and reportable signs/ symptoms    THN CM Care Plan Problem Two        Most Recent Value   Care Plan Problem Two  Knowledge deficit related to CHF   Role Documenting the Problem Two  Care Management Coordinator   Care Plan for Problem Two  Active   THN CM Short Term Goal #1 (0-30 days)  pt will verbalize CHF zones/ action plan within 30 days.   THN CM Short Term Goal #1 Start Date  07/22/15 [goal restarted]   Interventions for Short Term Goal #2   RN CM reinforced CHF zones/ action plan, importance of calling MD early for change in health status      Plan: follow up with home visit 08/19/15 Assess weight Follow up on blood thinner

## 2015-07-23 DIAGNOSIS — N184 Chronic kidney disease, stage 4 (severe): Secondary | ICD-10-CM | POA: Diagnosis not present

## 2015-07-23 DIAGNOSIS — Z6823 Body mass index (BMI) 23.0-23.9, adult: Secondary | ICD-10-CM | POA: Diagnosis not present

## 2015-07-23 DIAGNOSIS — G2581 Restless legs syndrome: Secondary | ICD-10-CM | POA: Diagnosis not present

## 2015-07-23 DIAGNOSIS — I10 Essential (primary) hypertension: Secondary | ICD-10-CM | POA: Diagnosis not present

## 2015-07-23 DIAGNOSIS — I482 Chronic atrial fibrillation: Secondary | ICD-10-CM | POA: Diagnosis not present

## 2015-07-24 ENCOUNTER — Telehealth: Payer: Self-pay | Admitting: *Deleted

## 2015-07-24 DIAGNOSIS — E1122 Type 2 diabetes mellitus with diabetic chronic kidney disease: Secondary | ICD-10-CM | POA: Diagnosis not present

## 2015-07-24 DIAGNOSIS — J449 Chronic obstructive pulmonary disease, unspecified: Secondary | ICD-10-CM | POA: Diagnosis not present

## 2015-07-24 DIAGNOSIS — I509 Heart failure, unspecified: Secondary | ICD-10-CM | POA: Diagnosis not present

## 2015-07-24 DIAGNOSIS — I4891 Unspecified atrial fibrillation: Secondary | ICD-10-CM | POA: Diagnosis not present

## 2015-07-24 DIAGNOSIS — I110 Hypertensive heart disease with heart failure: Secondary | ICD-10-CM | POA: Diagnosis not present

## 2015-07-24 DIAGNOSIS — N184 Chronic kidney disease, stage 4 (severe): Secondary | ICD-10-CM | POA: Diagnosis not present

## 2015-07-24 NOTE — Telephone Encounter (Signed)
Pt aware, routed to pcp 

## 2015-07-24 NOTE — Telephone Encounter (Signed)
-----   Message from Arnoldo Lenis, MD sent at 07/21/2015  3:01 PM EDT ----- Labs overall stable. Renal function remains decreased but stable within his baseline, remains mildly anemic which is also stable  J BrancH MD

## 2015-07-28 DIAGNOSIS — J432 Centrilobular emphysema: Secondary | ICD-10-CM | POA: Diagnosis not present

## 2015-07-28 DIAGNOSIS — I700 Atherosclerosis of aorta: Secondary | ICD-10-CM | POA: Diagnosis not present

## 2015-07-28 DIAGNOSIS — I2510 Atherosclerotic heart disease of native coronary artery without angina pectoris: Secondary | ICD-10-CM | POA: Diagnosis not present

## 2015-08-05 ENCOUNTER — Ambulatory Visit: Payer: Medicare Other | Admitting: Cardiology

## 2015-08-08 ENCOUNTER — Encounter: Payer: Self-pay | Admitting: Cardiology

## 2015-08-08 ENCOUNTER — Ambulatory Visit (INDEPENDENT_AMBULATORY_CARE_PROVIDER_SITE_OTHER): Payer: Medicare Other | Admitting: Cardiology

## 2015-08-08 VITALS — BP 130/76 | HR 77 | Ht 74.0 in | Wt 192.9 lb

## 2015-08-08 DIAGNOSIS — I4891 Unspecified atrial fibrillation: Secondary | ICD-10-CM

## 2015-08-08 MED ORDER — AMIODARONE HCL 200 MG PO TABS: 200.0000 mg | ORAL_TABLET | Freq: Every day | ORAL | 3 refills | Status: DC

## 2015-08-08 NOTE — Patient Instructions (Signed)
Your physician recommends that you schedule a follow-up appointment in: 2 MONTHS WITH DR. [place]  Your physician has recommended you make the following change in your medication:   CHANGE AMIODARONE 200 MG DAILY  STOP LASIX  Thank you for choosing [facility]!!

## 2015-08-08 NOTE — Progress Notes (Signed)
Clinical Summary Mr. Humbard is a 81 y.o.male seen today for follow up of the following medical problems. This is a focused visit on his history of afib and HTN   1. Afib  - new diagnosis 06/2015 during hospital admission - started on eliquis, patient felt it was causing some lightheadness and stopped taking. Was started on xarelto and seems to be tolerating well.  - 3 week monitor at discharge showed episodes of afib with RVR.   - last visit changed to amiodarone. He was having continued episodes of afib with RVR by monitor on diltiazem, did not have room for titration given soft bp's. Dilt stopped and started on amiodarone. - had to stop xarelto due to bright red blood per rectum. Has upcoming GI appt. Bleeding has resolved.        Past Medical History:  Diagnosis Date  . Acute renal failure ([place])   . Diabetes mellitus without complication ([place])   . Hypertension   . Neuropathy ([place])      No Known Allergies   Current Outpatient Prescriptions  Medication Sig Dispense Refill  . amiodarone (PACERONE) 200 MG tablet Take 1 tablet (200 mg total) by mouth 2 (two) times daily. 180 tablet 3  . cholecalciferol (VITAMIN D) 1000 units tablet Take 1,000 Units by mouth daily.    . furosemide (LASIX) 40 MG tablet Take 1 tablet (40 mg total) by mouth daily. 30 tablet 0  . gabapentin (NEURONTIN) 300 MG capsule Take 300 mg by mouth 2 (two) times daily.    Marland Kitchen glyBURIDE (DIABETA) 5 MG tablet Take 10 mg by mouth 2 (two) times daily with a meal.     . guaiFENesin (MUCINEX) 600 MG 12 hr tablet Take 1 tablet (600 mg total) by mouth 2 (two) times daily. 30 tablet 0  . levalbuterol (XOPENEX) 0.63 MG/3ML nebulizer solution Take 3 mLs (0.63 mg total) by nebulization every 6 (six) hours. 3 mL 3  . Multiple Vitamins-Minerals (MULTIVITAMIN WITH MINERALS) tablet Take 1 tablet by mouth daily.    . nicotine (NICODERM CQ - DOSED IN MG/24 HOURS) 14 mg/24hr patch Place 14 mg onto the skin daily. Reported  on 07/22/2015    . pramipexole (MIRAPEX) 1 MG tablet Take 1 mg by mouth at bedtime.    . Rivaroxaban (XARELTO) 15 MG TABS tablet Take 1 tablet (15 mg total) by mouth daily with supper. (Patient not taking: Reported on 07/22/2015) 70 tablet 0  . traZODone (DESYREL) 150 MG tablet Take 150 mg by mouth at bedtime.      No current facility-administered medications for this visit.      No past surgical history on file.   No Known Allergies    Family History  Problem Relation Age of Onset  . Family history unknown: Yes     Social History Mr. Huneke reports that he quit smoking about 2 months ago. His smoking use included Cigarettes. He started smoking about 67 years ago. He has a 68.00 pack-year smoking history. He has never used smokeless tobacco. Mr. Melody reports that he drinks alcohol.   Review of Systems CONSTITUTIONAL: No weight loss, fever, chills, weakness or fatigue.  HEENT: Eyes: No visual loss, blurred vision, double vision or yellow sclerae.No hearing loss, sneezing, congestion, runny nose or sore throat.  SKIN: No rash or itching.  CARDIOVASCULAR: per HPI RESPIRATORY: No shortness of breath, cough or sputum.  GASTROINTESTINAL: No anorexia, nausea, vomiting or diarrhea. No abdominal pain or blood.  GENITOURINARY: No burning  on urination, no polyuria NEUROLOGICAL: No headache, dizziness, syncope, paralysis, ataxia, numbness or tingling in the extremities. No change in bowel or bladder control.  MUSCULOSKELETAL: No muscle, back pain, joint pain or stiffness.  LYMPHATICS: No enlarged nodes. No history of splenectomy.  PSYCHIATRIC: No history of depression or anxiety.  ENDOCRINOLOGIC: No reports of sweating, cold or heat intolerance. No polyuria or polydipsia.  Marland Kitchen   Physical Examination Vitals:   08/08/15 1545  BP: 130/76  Pulse: 77   Vitals:   08/08/15 1545  Weight: 192 lb 14.4 oz (87.5 kg)  Height: 6\' 2"  (1.88 m)    Gen: resting comfortably, no acute  distress HEENT: no scleral icterus, pupils equal round and reactive, no palptable cervical adenopathy,  CV: RRR, no m/r/g, no jvd Resp: Clear to auscultation bilaterally GI: abdomen is soft, non-tender, non-distended, normal bowel sounds, no hepatosplenomegaly MSK: extremities are warm, no edema.  Skin: warm, no rash Neuro:  no focal deficits Psych: appropriate affect    Assessment and Plan    1. Afib - recent monitor showed episodes of afib with RVR despite being on diltiazem at home - EKG in clinic shows rate controlled afib, he will continue amiodarone. Off anticoag due to recent GI bleeding, we will f/u recs from his upcoming GI appointment. - since off anticoag trial of DCCV is not an option, continue medical therapy.    2. HTN - previously low bp's, have improved off dilt - continue to monitor    F/u 2 months     Arnoldo Lenis, M.D.

## 2015-08-11 ENCOUNTER — Ambulatory Visit: Payer: Medicare Other | Admitting: Gastroenterology

## 2015-08-12 ENCOUNTER — Encounter: Payer: Self-pay | Admitting: *Deleted

## 2015-08-12 DIAGNOSIS — I4891 Unspecified atrial fibrillation: Secondary | ICD-10-CM | POA: Diagnosis not present

## 2015-08-12 DIAGNOSIS — I509 Heart failure, unspecified: Secondary | ICD-10-CM | POA: Diagnosis not present

## 2015-08-12 DIAGNOSIS — J449 Chronic obstructive pulmonary disease, unspecified: Secondary | ICD-10-CM | POA: Diagnosis not present

## 2015-08-12 DIAGNOSIS — I110 Hypertensive heart disease with heart failure: Secondary | ICD-10-CM | POA: Diagnosis not present

## 2015-08-12 DIAGNOSIS — N184 Chronic kidney disease, stage 4 (severe): Secondary | ICD-10-CM | POA: Diagnosis not present

## 2015-08-12 DIAGNOSIS — E1122 Type 2 diabetes mellitus with diabetic chronic kidney disease: Secondary | ICD-10-CM | POA: Diagnosis not present

## 2015-08-19 ENCOUNTER — Ambulatory Visit: Payer: Self-pay | Admitting: *Deleted

## 2015-08-20 DIAGNOSIS — J449 Chronic obstructive pulmonary disease, unspecified: Secondary | ICD-10-CM | POA: Diagnosis not present

## 2015-08-20 DIAGNOSIS — N184 Chronic kidney disease, stage 4 (severe): Secondary | ICD-10-CM | POA: Diagnosis not present

## 2015-08-20 DIAGNOSIS — E1122 Type 2 diabetes mellitus with diabetic chronic kidney disease: Secondary | ICD-10-CM | POA: Diagnosis not present

## 2015-08-20 DIAGNOSIS — I509 Heart failure, unspecified: Secondary | ICD-10-CM | POA: Diagnosis not present

## 2015-08-20 DIAGNOSIS — I110 Hypertensive heart disease with heart failure: Secondary | ICD-10-CM | POA: Diagnosis not present

## 2015-08-20 DIAGNOSIS — I4891 Unspecified atrial fibrillation: Secondary | ICD-10-CM | POA: Diagnosis not present

## 2015-08-27 ENCOUNTER — Other Ambulatory Visit: Payer: Self-pay | Admitting: *Deleted

## 2015-08-27 ENCOUNTER — Encounter: Payer: Self-pay | Admitting: *Deleted

## 2015-08-27 NOTE — Patient Outreach (Signed)
Dylan Williamson) Care Management   08/27/2015  Dylan Williamson 04-20-1934 [id number]  Dylan Williamson is an 81 y.o. male  Subjective: Routine home visit with pt, HIPPA verified, pt reports he is attending all appointments, has new medication pramipexole and states he is taking it at present but will not be getting it refilled because he cannot afford. Pt states furosemide has been decreased by cardiologist.  Pt reports weight is increased due to "eating much better"  Pt continues weighing daily and recording.  Pt states he has had no syncopal episodes.  Objective:   Vitals:   08/27/15 1818  BP: 122/62  Pulse: 78  Resp: 18  SpO2: 93%  Weight: 190 lb (86.2 kg)   ROS  Physical Exam  Encounter Medications:   Outpatient Encounter Prescriptions as of 08/27/2015  Medication Sig Note  . amiodarone (PACERONE) 200 MG tablet Take 1 tablet (200 mg total) by mouth daily.   . cholecalciferol (VITAMIN D) 1000 units tablet Take 1,000 Units by mouth daily.   Marland Kitchen gabapentin (NEURONTIN) 300 MG capsule Take 300 mg by mouth 2 (two) times daily.   Marland Kitchen glyBURIDE (DIABETA) 5 MG tablet Take 10 mg by mouth 2 (two) times daily with a meal.    . levalbuterol (XOPENEX) 0.63 MG/3ML nebulizer solution Take 3 mLs (0.63 mg total) by nebulization every 6 (six) hours. (Patient taking differently: Take 0.63 mg by nebulization as needed. )   . Multiple Vitamins-Minerals (MULTIVITAMIN WITH MINERALS) tablet Take 1 tablet by mouth daily.   . pramipexole (MIRAPEX) 1 MG tablet Take 1 mg by mouth at bedtime.   . Rivaroxaban (XARELTO) 15 MG TABS tablet Take 1 tablet (15 mg total) by mouth daily with supper. 07/22/2015: Pt reports he was instructed not to take until sees cardiologist on 08/05/15 (due to rectal bleeding)   No facility-administered encounter medications on file as of 08/27/2015.     Functional Status:   In your present state of health, do you have any difficulty performing the following  activities: 06/23/2015 06/17/2015  Hearing? N N  Vision? N N  Difficulty concentrating or making decisions? N N  Walking or climbing stairs? Y N  Dressing or bathing? N N  Doing errands, shopping? N N  Preparing Food and eating ? N -  Using the Toilet? N -  In the past six months, have you accidently leaked urine? Y -  Do you have problems with loss of bowel control? N -  Managing your Medications? N -  Managing your Finances? N -  Housekeeping or managing your Housekeeping? N -  Some recent data might be hidden    Fall/Depression Screening:    PHQ 2/9 Scores 06/23/2015 06/10/2015  PHQ - 2 Score 0 0    Assessment:  RN CM sent InBasket to [facility] pt states he will not be able to afford pramipexole in the future.  RN CM faxed case closure letter to primary MD Dr. Rory Percy, mailed case closure letter to pt.  Pt managing well at home with the assistance of his family.   THN CM Care Plan Problem One   Flowsheet Row Most Recent Value  Care Plan Problem One  Pt high risk for hospital readmission related to disease processes (Atrial Fibrillation/ syncope)  Role Documenting the Problem One  Care Management [place] for Problem One  Active  THN Long Term Goal (31-90 days)  pt will verbalize better understanding of disease processes  and have no hospital readmissions within 90 days  THN Long Term Goal Start Date  06/06/15  [facility] Long Term Goal Met Date  08/27/15  Interventions for Problem One Long Term Goal  RN CM reinforced disease process Atrial Fibrillation  THN CM Short Term Goal #2 (0-30 days)  pt will verbalize medicaitons for atrial fibrillation within 30 days  THN CM Short Term Goal #2 Start Date  07/22/15 [goal restarted]  THN CM Short Term Goal #2 Met Date  08/27/15  Interventions for Short Term Goal #2  RN CM reinforced signs/ symptoms atrial fibrillation and reportable signs/ symptoms    THN CM Care Plan Problem Two   Flowsheet Row  Most Recent Value  Care Plan Problem Two  Knowledge deficit related to CHF  Role Documenting the Problem Two  Care Management Coordinator  Care Plan for Problem Two  Active  THN CM Short Term Goal #1 (0-30 days)  pt will verbalize CHF zones/ action plan within 30 days.  THN CM Short Term Goal #1 Start Date  07/22/15 [goal restarted]  [facility] CM Short Term Goal #1 Met Date   08/27/15  Interventions for Short Term Goal #2   RN CM reinforced CHF zones/ action plan with emphasis on yellow zone,  importance of calling MD early for change in health status     Plan: RN CM discharge today Pharmacy still active  Jacqlyn Larsen [facility], [place] Coordinator [phone]

## 2015-09-01 ENCOUNTER — Other Ambulatory Visit: Payer: Self-pay | Admitting: Pharmacist

## 2015-09-01 NOTE — Patient Outreach (Signed)
Receive a message from Dylan Williamson that patient is having difficulty with affording his pramipexole. Called and spoke with patient. HIPAA identifiers verified and verbal consent received. Dylan Williamson reports that he was having difficulty with affording this medication, but that "this has all been resolved now". Patient denies any further medication questions or concerns.   Harlow Asa, PharmD Clinical Pharmacist [facility] [phone]

## 2015-10-16 DIAGNOSIS — R634 Abnormal weight loss: Secondary | ICD-10-CM | POA: Diagnosis not present

## 2015-10-16 DIAGNOSIS — Z6824 Body mass index (BMI) 24.0-24.9, adult: Secondary | ICD-10-CM | POA: Diagnosis not present

## 2015-10-16 DIAGNOSIS — G2581 Restless legs syndrome: Secondary | ICD-10-CM | POA: Diagnosis not present

## 2015-10-16 DIAGNOSIS — I482 Chronic atrial fibrillation: Secondary | ICD-10-CM | POA: Diagnosis not present

## 2015-10-16 DIAGNOSIS — M1389 Other specified arthritis, multiple sites: Secondary | ICD-10-CM | POA: Diagnosis not present

## 2015-10-16 DIAGNOSIS — I10 Essential (primary) hypertension: Secondary | ICD-10-CM | POA: Diagnosis not present

## 2015-10-16 DIAGNOSIS — E1349 Other specified diabetes mellitus with other diabetic neurological complication: Secondary | ICD-10-CM | POA: Diagnosis not present

## 2015-10-16 DIAGNOSIS — N183 Chronic kidney disease, stage 3 (moderate): Secondary | ICD-10-CM | POA: Diagnosis not present

## 2015-10-16 DIAGNOSIS — J441 Chronic obstructive pulmonary disease with (acute) exacerbation: Secondary | ICD-10-CM | POA: Diagnosis not present

## 2015-10-17 DIAGNOSIS — M353 Polymyalgia rheumatica: Secondary | ICD-10-CM | POA: Diagnosis not present

## 2015-10-17 DIAGNOSIS — E039 Hypothyroidism, unspecified: Secondary | ICD-10-CM | POA: Diagnosis not present

## 2015-10-20 ENCOUNTER — Encounter: Payer: Self-pay | Admitting: Cardiology

## 2015-10-20 ENCOUNTER — Ambulatory Visit (INDEPENDENT_AMBULATORY_CARE_PROVIDER_SITE_OTHER): Payer: Medicare Other | Admitting: Cardiology

## 2015-10-20 VITALS — BP 135/78 | HR 80 | Ht 74.0 in | Wt 195.0 lb

## 2015-10-20 DIAGNOSIS — I10 Essential (primary) hypertension: Secondary | ICD-10-CM

## 2015-10-20 DIAGNOSIS — I480 Paroxysmal atrial fibrillation: Secondary | ICD-10-CM | POA: Diagnosis not present

## 2015-10-20 MED ORDER — ASPIRIN EC 81 MG PO TBEC: 81.0000 mg | DELAYED_RELEASE_TABLET | Freq: Every day | ORAL | Status: AC

## 2015-10-20 NOTE — Patient Instructions (Addendum)
Medication Instructions:   Begin Aspirin 81mg  daily.  Continue all other current medications.  Labwork: none  Testing/Procedures: none  Follow-Up: Your physician wants you to follow up in:  4 months.  You will receive a reminder letter in the mail one-two months in advance.  If you don't receive a letter, please call our office to schedule the follow up appointment   Any Other Special Instructions Will Be Listed Below (If Applicable).  If you need a refill on your cardiac medications before your next appointment, please call your pharmacy.

## 2015-10-20 NOTE — Progress Notes (Signed)
Clinical Summary Dylan Williamson is a 81 y.o.male seen today for follow up of the following medical problems.    1. Afib - new diagnosis 06/2015 during hospital admission - started on eliquis, patient felt it was causing some lightheadness and stopped taking. Was started on xarelto and seems to be tolerating well.  - 3 week monitor at discharge showed episodes of afib with RVR.  - changed to amiodarone. He was having continued episodes of afib with RVR by monitor on diltiazem alone, but did not have room for titration given soft bp's. - had to stop xarelto due to bright red blood per rectum. He cancelled his GI appointment, and has refused to restart anticoag.   - no recent palpitations since last visit.    Past Medical History:  Diagnosis Date  . Acute renal failure ([place])   . Diabetes mellitus without complication ([place])   . Hypertension   . Neuropathy ([place])      No Known Allergies   Current Outpatient Prescriptions  Medication Sig Dispense Refill  . amiodarone (PACERONE) 200 MG tablet Take 1 tablet (200 mg total) by mouth daily. 90 tablet 3  . cholecalciferol (VITAMIN D) 1000 units tablet Take 1,000 Units by mouth daily.    . furosemide (LASIX) 40 MG tablet Take 40 mg by mouth.    . gabapentin (NEURONTIN) 300 MG capsule Take 300 mg by mouth 2 (two) times daily.    Marland Kitchen glyBURIDE (DIABETA) 5 MG tablet Take 10 mg by mouth 2 (two) times daily with a meal.     . levalbuterol (XOPENEX) 0.63 MG/3ML nebulizer solution Take 3 mLs (0.63 mg total) by nebulization every 6 (six) hours. (Patient taking differently: Take 0.63 mg by nebulization as needed. ) 3 mL 3  . Multiple Vitamins-Minerals (MULTIVITAMIN WITH MINERALS) tablet Take 1 tablet by mouth daily.    . pramipexole (MIRAPEX) 1 MG tablet Take 1 mg by mouth at bedtime.    . Rivaroxaban (XARELTO) 15 MG TABS tablet Take 1 tablet (15 mg total) by mouth daily with supper. 70 tablet 0   No current facility-administered medications  for this visit.      No past surgical history on file.   No Known Allergies    Family History  Problem Relation Age of Onset  . Family history unknown: Yes     Social History Dylan Williamson reports that he quit smoking about 4 months ago. His smoking use included Cigarettes and E-cigarettes. He started smoking about 68 years ago. He has a 68.00 pack-year smoking history. He has never used smokeless tobacco. Dylan Williamson reports that he drinks alcohol.   Review of Systems CONSTITUTIONAL: No weight loss, fever, chills, weakness or fatigue.  HEENT: Eyes: No visual loss, blurred vision, double vision or yellow sclerae.No hearing loss, sneezing, congestion, runny nose or sore throat.  SKIN: No rash or itching.  CARDIOVASCULAR: per HPI RESPIRATORY: No shortness of breath, cough or sputum.  GASTROINTESTINAL: No anorexia, nausea, vomiting or diarrhea. No abdominal pain or blood.  GENITOURINARY: No burning on urination, no polyuria NEUROLOGICAL: No headache, dizziness, syncope, paralysis, ataxia, numbness or tingling in the extremities. No change in bowel or bladder control.  MUSCULOSKELETAL: No muscle, back pain, joint pain or stiffness.  LYMPHATICS: No enlarged nodes. No history of splenectomy.  PSYCHIATRIC: No history of depression or anxiety.  ENDOCRINOLOGIC: No reports of sweating, cold or heat intolerance. No polyuria or polydipsia.  Marland Kitchen   Physical Examination Vitals:   10/20/15 1605  BP: 135/78  Pulse: 80   Vitals:   10/20/15 1605  Weight: 195 lb (88.5 kg)  Height: 6\' 2"  (1.88 m)    Gen: resting comfortably, no acute distress HEENT: no scleral icterus, pupils equal round and reactive, no palptable cervical adenopathy,  CV: RRR, no m/r/g, no jvd Resp: Clear to auscultation bilaterally GI: abdomen is soft, non-tender, non-distended, normal bowel sounds, no hepatosplenomegaly MSK: extremities are warm, no edema.  Skin: warm, no rash Neuro:  no focal deficits Psych:  appropriate affect      Assessment and Plan  1. PAF - EKG in clinic shows SR, he is maintaining on amiodarone - he refuses anticoag sicne rcent GI bleed, he cancelled his GI appointment.  - we spoke in depth about increased risk of stroke in setting of his afib and the indications for anticoagulation with he and his daughter, he continues to refuse at this time - continue current meds, no recent symptoms. Start ASA  2. HTN - at goal, continue current meds   Dylan Williamson, M.D.

## 2015-11-11 DIAGNOSIS — E1349 Other specified diabetes mellitus with other diabetic neurological complication: Secondary | ICD-10-CM | POA: Diagnosis not present

## 2015-11-11 DIAGNOSIS — N184 Chronic kidney disease, stage 4 (severe): Secondary | ICD-10-CM | POA: Diagnosis not present

## 2015-11-11 DIAGNOSIS — I10 Essential (primary) hypertension: Secondary | ICD-10-CM | POA: Diagnosis not present

## 2015-11-11 DIAGNOSIS — E039 Hypothyroidism, unspecified: Secondary | ICD-10-CM | POA: Diagnosis not present

## 2015-11-11 DIAGNOSIS — J441 Chronic obstructive pulmonary disease with (acute) exacerbation: Secondary | ICD-10-CM | POA: Diagnosis not present

## 2015-11-14 DIAGNOSIS — E1349 Other specified diabetes mellitus with other diabetic neurological complication: Secondary | ICD-10-CM | POA: Diagnosis not present

## 2015-11-14 DIAGNOSIS — I10 Essential (primary) hypertension: Secondary | ICD-10-CM | POA: Diagnosis not present

## 2015-11-14 DIAGNOSIS — I482 Chronic atrial fibrillation: Secondary | ICD-10-CM | POA: Diagnosis not present

## 2015-11-14 DIAGNOSIS — G2581 Restless legs syndrome: Secondary | ICD-10-CM | POA: Diagnosis not present

## 2015-11-14 DIAGNOSIS — J441 Chronic obstructive pulmonary disease with (acute) exacerbation: Secondary | ICD-10-CM | POA: Diagnosis not present

## 2015-11-14 DIAGNOSIS — Z6824 Body mass index (BMI) 24.0-24.9, adult: Secondary | ICD-10-CM | POA: Diagnosis not present

## 2015-11-14 DIAGNOSIS — E039 Hypothyroidism, unspecified: Secondary | ICD-10-CM | POA: Diagnosis not present

## 2016-01-12 DIAGNOSIS — E1349 Other specified diabetes mellitus with other diabetic neurological complication: Secondary | ICD-10-CM | POA: Diagnosis not present

## 2016-01-12 DIAGNOSIS — E039 Hypothyroidism, unspecified: Secondary | ICD-10-CM | POA: Diagnosis not present

## 2016-01-12 DIAGNOSIS — I10 Essential (primary) hypertension: Secondary | ICD-10-CM | POA: Diagnosis not present

## 2016-01-14 DIAGNOSIS — G2581 Restless legs syndrome: Secondary | ICD-10-CM | POA: Diagnosis not present

## 2016-01-14 DIAGNOSIS — E1349 Other specified diabetes mellitus with other diabetic neurological complication: Secondary | ICD-10-CM | POA: Diagnosis not present

## 2016-01-14 DIAGNOSIS — E039 Hypothyroidism, unspecified: Secondary | ICD-10-CM | POA: Diagnosis not present

## 2016-01-14 DIAGNOSIS — N183 Chronic kidney disease, stage 3 (moderate): Secondary | ICD-10-CM | POA: Diagnosis not present

## 2016-01-14 DIAGNOSIS — Z6825 Body mass index (BMI) 25.0-25.9, adult: Secondary | ICD-10-CM | POA: Diagnosis not present

## 2016-01-14 DIAGNOSIS — I482 Chronic atrial fibrillation: Secondary | ICD-10-CM | POA: Diagnosis not present

## 2016-02-23 ENCOUNTER — Ambulatory Visit (INDEPENDENT_AMBULATORY_CARE_PROVIDER_SITE_OTHER): Payer: Medicare Other | Admitting: Cardiology

## 2016-02-23 ENCOUNTER — Encounter: Payer: Self-pay | Admitting: *Deleted

## 2016-02-23 ENCOUNTER — Encounter: Payer: Self-pay | Admitting: Cardiology

## 2016-02-23 VITALS — BP 136/81 | HR 83 | Ht 74.0 in | Wt 198.4 lb

## 2016-02-23 DIAGNOSIS — I10 Essential (primary) hypertension: Secondary | ICD-10-CM

## 2016-02-23 DIAGNOSIS — I480 Paroxysmal atrial fibrillation: Secondary | ICD-10-CM

## 2016-02-23 NOTE — Patient Instructions (Signed)

## 2016-02-23 NOTE — Progress Notes (Signed)
Clinical Summary Mr. Geil is a 82 y.o.male seen today for follow up of the following medical problems.    1. Afib - new diagnosis 06/2015 during hospital admission - started on eliquis, patient felt it was causing some lightheadness and stopped taking. Was started on xarelto and seems to be tolerating well.  - 3 week monitor at discharge showed episodes of afib with RVR.  - changed to amiodarone. He was having continued episodes of afib with RVR by monitor on diltiazem alone, but did not have room for titration given soft bp's. - had to stop xarelto due to bright red blood per rectum. He cancelled his GI appointment, and has refused to restart anticoag.    - denies any recent palpitations - compliant with meds. Reports recent labs with pcp  Past Medical History:  Diagnosis Date  . Acute renal failure ([place])   . Diabetes mellitus without complication ([place])   . Hypertension   . Neuropathy ([place])      No Known Allergies   Current Outpatient Prescriptions  Medication Sig Dispense Refill  . amiodarone (PACERONE) 200 MG tablet Take 1 tablet (200 mg total) by mouth daily. 90 tablet 3  . aspirin EC 81 MG tablet Take 1 tablet (81 mg total) by mouth daily.    . cholecalciferol (VITAMIN D) 1000 units tablet Take 1,000 Units by mouth daily.    . furosemide (LASIX) 40 MG tablet Take 40 mg by mouth.    . gabapentin (NEURONTIN) 300 MG capsule Take 300 mg by mouth 2 (two) times daily.    Marland Kitchen glyBURIDE (DIABETA) 5 MG tablet Take 10 mg by mouth 2 (two) times daily with a meal.     . levothyroxine (SYNTHROID, LEVOTHROID) 25 MCG tablet Take 25 mcg by mouth daily before breakfast.    . Multiple Vitamins-Minerals (MULTIVITAMIN WITH MINERALS) tablet Take 1 tablet by mouth daily.    . predniSONE (DELTASONE) 10 MG tablet Take 10 mg by mouth 2 (two) times daily with a meal.     No current facility-administered medications for this visit.      Past Surgical History:  Procedure Laterality  Date  . NO PAST SURGERIES       No Known Allergies    Family History  Problem Relation Age of Onset  . Heart disease Sister   . Pneumonia Brother      Social History Mr. Mckesson reports that he has been smoking Cigarettes and E-cigarettes.  He started smoking about 68 years ago. He has been smoking about 1.00 pack per day. He has never used smokeless tobacco. Mr. Wachsmuth reports that he drinks alcohol.   Review of Systems CONSTITUTIONAL: No weight loss, fever, chills, weakness or fatigue.  HEENT: Eyes: No visual loss, blurred vision, double vision or yellow sclerae.No hearing loss, sneezing, congestion, runny nose or sore throat.  SKIN: No rash or itching.  CARDIOVASCULAR: per HPI RESPIRATORY: No shortness of breath, cough or sputum.  GASTROINTESTINAL: No anorexia, nausea, vomiting or diarrhea. No abdominal pain or blood.  GENITOURINARY: No burning on urination, no polyuria NEUROLOGICAL: No headache, dizziness, syncope, paralysis, ataxia, numbness or tingling in the extremities. No change in bowel or bladder control.  MUSCULOSKELETAL: No muscle, back pain, joint pain or stiffness.  LYMPHATICS: No enlarged nodes. No history of splenectomy.  PSYCHIATRIC: No history of depression or anxiety.  ENDOCRINOLOGIC: No reports of sweating, cold or heat intolerance. No polyuria or polydipsia.  Marland Kitchen   Physical Examination Vitals:   02/23/16  1614  BP: 136/81  Pulse: 83   Vitals:   02/23/16 1614  Weight: 198 lb 6.4 oz (90 kg)  Height: 6\' 2"  (1.88 m)    Gen: resting comfortably, no acute distress HEENT: no scleral icterus, pupils equal round and reactive, no palptable cervical adenopathy,  CV: RRR, no m/r/g, no jvd Resp: Clear to auscultation bilaterally GI: abdomen is soft, non-tender, non-distended, normal bowel sounds, no hepatosplenomegaly MSK: extremities are warm, no edema.  Skin: warm, no rash Neuro:  no focal deficits Psych: appropriate affect      Assessment  and Plan  1. PAF - continues to refuse anticoagulation - no current symptoms - contniue current meds  2. HTN - at goal, he will continue current meds   F/u 6 months. Request labs from pcp. If no liver panel and TSH will need ordered at next visit.    Arnoldo Lenis, M.D.,

## 2016-04-01 DIAGNOSIS — E039 Hypothyroidism, unspecified: Secondary | ICD-10-CM | POA: Diagnosis not present

## 2016-04-01 DIAGNOSIS — E119 Type 2 diabetes mellitus without complications: Secondary | ICD-10-CM | POA: Diagnosis not present

## 2016-04-01 DIAGNOSIS — N184 Chronic kidney disease, stage 4 (severe): Secondary | ICD-10-CM | POA: Diagnosis not present

## 2016-04-01 DIAGNOSIS — I10 Essential (primary) hypertension: Secondary | ICD-10-CM | POA: Diagnosis not present

## 2016-04-07 DIAGNOSIS — E1349 Other specified diabetes mellitus with other diabetic neurological complication: Secondary | ICD-10-CM | POA: Diagnosis not present

## 2016-04-07 DIAGNOSIS — N183 Chronic kidney disease, stage 3 (moderate): Secondary | ICD-10-CM | POA: Diagnosis not present

## 2016-04-07 DIAGNOSIS — Z6824 Body mass index (BMI) 24.0-24.9, adult: Secondary | ICD-10-CM | POA: Diagnosis not present

## 2016-04-07 DIAGNOSIS — I482 Chronic atrial fibrillation: Secondary | ICD-10-CM | POA: Diagnosis not present

## 2016-04-07 DIAGNOSIS — G2581 Restless legs syndrome: Secondary | ICD-10-CM | POA: Diagnosis not present

## 2016-04-07 DIAGNOSIS — E039 Hypothyroidism, unspecified: Secondary | ICD-10-CM | POA: Diagnosis not present

## 2016-04-07 DIAGNOSIS — I10 Essential (primary) hypertension: Secondary | ICD-10-CM | POA: Diagnosis not present

## 2016-04-20 DIAGNOSIS — E1349 Other specified diabetes mellitus with other diabetic neurological complication: Secondary | ICD-10-CM | POA: Diagnosis not present

## 2016-04-20 DIAGNOSIS — J441 Chronic obstructive pulmonary disease with (acute) exacerbation: Secondary | ICD-10-CM | POA: Diagnosis not present

## 2016-04-20 DIAGNOSIS — G2581 Restless legs syndrome: Secondary | ICD-10-CM | POA: Diagnosis not present

## 2016-04-20 DIAGNOSIS — M353 Polymyalgia rheumatica: Secondary | ICD-10-CM | POA: Diagnosis not present

## 2016-04-20 DIAGNOSIS — I10 Essential (primary) hypertension: Secondary | ICD-10-CM | POA: Diagnosis not present

## 2016-04-20 DIAGNOSIS — N184 Chronic kidney disease, stage 4 (severe): Secondary | ICD-10-CM | POA: Diagnosis not present

## 2016-04-20 DIAGNOSIS — Z716 Tobacco abuse counseling: Secondary | ICD-10-CM | POA: Diagnosis not present

## 2016-06-29 DIAGNOSIS — M7500 Adhesive capsulitis of unspecified shoulder: Secondary | ICD-10-CM | POA: Diagnosis not present

## 2016-06-29 DIAGNOSIS — Z6823 Body mass index (BMI) 23.0-23.9, adult: Secondary | ICD-10-CM | POA: Diagnosis not present

## 2016-07-13 DIAGNOSIS — Z6823 Body mass index (BMI) 23.0-23.9, adult: Secondary | ICD-10-CM | POA: Diagnosis not present

## 2016-07-13 DIAGNOSIS — J441 Chronic obstructive pulmonary disease with (acute) exacerbation: Secondary | ICD-10-CM | POA: Diagnosis not present

## 2016-07-13 DIAGNOSIS — I482 Chronic atrial fibrillation: Secondary | ICD-10-CM | POA: Diagnosis not present

## 2016-07-13 DIAGNOSIS — G2581 Restless legs syndrome: Secondary | ICD-10-CM | POA: Diagnosis not present

## 2016-07-13 DIAGNOSIS — M7500 Adhesive capsulitis of unspecified shoulder: Secondary | ICD-10-CM | POA: Diagnosis not present

## 2016-07-13 DIAGNOSIS — N184 Chronic kidney disease, stage 4 (severe): Secondary | ICD-10-CM | POA: Diagnosis not present

## 2016-07-13 DIAGNOSIS — G6289 Other specified polyneuropathies: Secondary | ICD-10-CM | POA: Diagnosis not present

## 2016-07-13 DIAGNOSIS — E1349 Other specified diabetes mellitus with other diabetic neurological complication: Secondary | ICD-10-CM | POA: Diagnosis not present

## 2016-07-29 ENCOUNTER — Other Ambulatory Visit: Payer: Self-pay | Admitting: Cardiology

## 2016-07-30 ENCOUNTER — Other Ambulatory Visit: Payer: Self-pay | Admitting: Cardiology

## 2016-07-30 MED ORDER — AMIODARONE HCL 200 MG PO TABS: 200.0000 mg | ORAL_TABLET | Freq: Every day | ORAL | 1 refills | Status: DC

## 2016-07-30 NOTE — Telephone Encounter (Signed)
amiodarone (PACERONE) 200 MG tablet   Out of medication  Please send to [facility] in [place]

## 2016-07-30 NOTE — Telephone Encounter (Signed)
Medication sent to pharmacy  

## 2016-09-16 DIAGNOSIS — Z6823 Body mass index (BMI) 23.0-23.9, adult: Secondary | ICD-10-CM | POA: Diagnosis not present

## 2016-09-16 DIAGNOSIS — M7500 Adhesive capsulitis of unspecified shoulder: Secondary | ICD-10-CM | POA: Diagnosis not present

## 2016-09-16 DIAGNOSIS — J441 Chronic obstructive pulmonary disease with (acute) exacerbation: Secondary | ICD-10-CM | POA: Diagnosis not present

## 2016-09-16 DIAGNOSIS — N184 Chronic kidney disease, stage 4 (severe): Secondary | ICD-10-CM | POA: Diagnosis not present

## 2016-09-16 DIAGNOSIS — G2581 Restless legs syndrome: Secondary | ICD-10-CM | POA: Diagnosis not present

## 2016-09-16 DIAGNOSIS — G6289 Other specified polyneuropathies: Secondary | ICD-10-CM | POA: Diagnosis not present

## 2016-09-16 DIAGNOSIS — E1349 Other specified diabetes mellitus with other diabetic neurological complication: Secondary | ICD-10-CM | POA: Diagnosis not present

## 2016-09-16 DIAGNOSIS — I482 Chronic atrial fibrillation: Secondary | ICD-10-CM | POA: Diagnosis not present

## 2016-10-04 ENCOUNTER — Ambulatory Visit (INDEPENDENT_AMBULATORY_CARE_PROVIDER_SITE_OTHER): Payer: Medicare Other | Admitting: Cardiology

## 2016-10-04 ENCOUNTER — Encounter: Payer: Self-pay | Admitting: Cardiology

## 2016-10-04 VITALS — BP 122/64 | HR 87 | Ht 74.0 in | Wt 184.2 lb

## 2016-10-04 DIAGNOSIS — I480 Paroxysmal atrial fibrillation: Secondary | ICD-10-CM | POA: Diagnosis not present

## 2016-10-04 DIAGNOSIS — E039 Hypothyroidism, unspecified: Secondary | ICD-10-CM

## 2016-10-04 MED ORDER — AMIODARONE HCL 100 MG PO TABS: 100.0000 mg | ORAL_TABLET | Freq: Every day | ORAL | 1 refills | Status: DC

## 2016-10-04 NOTE — Progress Notes (Signed)
Clinical Summary Dylan Williamson is a 82 y.o.male seen today for follow up of the following medical problems.    1. Afib - new diagnosis 06/2015 during hospital admission - started on eliquis, patient felt it was causing some lightheadness and stopped taking. Was started on xarelto and seems to be tolerating well.  - 3 week monitor at discharge showed episodes of afib with RVR.  - changed to amiodarone. He was having continued episodes of afib with RVR by monitor on diltiazem alone, but did not have room for titration given soft bp's. - had to stop xarelto due to bright red blood per rectum. He cancelled his GI appointment, and has refused to restart anticoag.     Jan 2018: TSH 7, normal LFTs -started on synthroid within the last year - denies any palpitations. Continues to refuse anticoagulation   Past Medical History:  Diagnosis Date  . Acute renal failure ([place])   . Diabetes mellitus without complication ([place])   . Hypertension   . Neuropathy ([place])      No Known Allergies   Current Outpatient Prescriptions  Medication Sig Dispense Refill  . amiodarone (PACERONE) 200 MG tablet Take 1 tablet (200 mg total) by mouth daily. 90 tablet 1  . aspirin EC 81 MG tablet Take 1 tablet (81 mg total) by mouth daily.    . cholecalciferol (VITAMIN D) 1000 units tablet Take 1,000 Units by mouth daily.    . furosemide (LASIX) 40 MG tablet Take 40 mg by mouth.    . gabapentin (NEURONTIN) 300 MG capsule Take 300 mg by mouth 2 (two) times daily.    Marland Kitchen glyBURIDE (DIABETA) 5 MG tablet Take 10 mg by mouth 2 (two) times daily with a meal.     . levothyroxine (SYNTHROID, LEVOTHROID) 25 MCG tablet Take 25 mcg by mouth daily before breakfast.    . Multiple Vitamins-Minerals (MULTIVITAMIN WITH MINERALS) tablet Take 1 tablet by mouth daily.    . predniSONE (DELTASONE) 10 MG tablet Take 10 mg by mouth 2 (two) times daily with a meal.     No current facility-administered medications for this  visit.      Past Surgical History:  Procedure Laterality Date  . NO PAST SURGERIES       No Known Allergies    Family History  Problem Relation Age of Onset  . Heart disease Sister   . Pneumonia Brother      Social History Dylan Williamson reports that he has been smoking Cigarettes and E-cigarettes.  He started smoking about 69 years ago. He has been smoking about 1.00 pack per day. He has never used smokeless tobacco. Dylan Williamson reports that he drinks alcohol.   Review of Systems CONSTITUTIONAL: No weight loss, fever, chills, weakness or fatigue.  HEENT: Eyes: No visual loss, blurred vision, double vision or yellow sclerae.No hearing loss, sneezing, congestion, runny nose or sore throat.  SKIN: No rash or itching.  CARDIOVASCULAR: per hpi RESPIRATORY: No shortness of breath, cough or sputum.  GASTROINTESTINAL: No anorexia, nausea, vomiting or diarrhea. No abdominal pain or blood.  GENITOURINARY: No burning on urination, no polyuria NEUROLOGICAL: No headache, dizziness, syncope, paralysis, ataxia, numbness or tingling in the extremities. No change in bowel or bladder control.  MUSCULOSKELETAL: No muscle, back pain, joint pain or stiffness.  LYMPHATICS: No enlarged nodes. No history of splenectomy.  PSYCHIATRIC: No history of depression or anxiety.  ENDOCRINOLOGIC: No reports of sweating, cold or heat intolerance. No polyuria or polydipsia.  Marland Kitchen  Physical Examination Vitals:   10/04/16 1306  BP: 122/64  Pulse: 87  SpO2: 98%   Vitals:   10/04/16 1306  Weight: 184 lb 3.2 oz (83.6 kg)  Height: 6\' 2"  (1.88 m)    Gen: resting comfortably, no acute distress HEENT: no scleral icterus, pupils equal round and reactive, no palptable cervical adenopathy,  CV: RRR, no m/r/g, no jvd Resp: Clear to auscultation bilaterally GI: abdomen is soft, non-tender, non-distended, normal bowel sounds, no hepatosplenomegaly MSK: extremities are warm, no edema.  Skin: warm, no  rash Neuro:  no focal deficits Psych: appropriate affect   Assessment and Plan  1. PAF - no recent symptoms. He continues to refuse anticoagulation. We again today talked in detail about afib and the associated risk of stroke and how anticoagulation is best option to lower this risk. He understands but continues to refuse antiocoagulation. Prior GI bleeding on xarelto, he refused to GI as well.  - recent issues with hypothyroidism that may be related to amiodarone. We will lower dose to 100mg  daily, repeat CMET and TSH - EKG in [facility] today shows SR  F/u 3 months      Arnoldo Lenis, M.D.

## 2016-10-04 NOTE — Patient Instructions (Signed)
Your physician recommends that you schedule a follow-up appointment in: [place] has recommended you make the following change in your medication:   DECREASE AMIODARONE 100 MG DAILY  Your physician recommends that you return for lab work TSH/CMP  Thank you for choosing [facility]!!

## 2016-11-02 DIAGNOSIS — H40053 Ocular hypertension, bilateral: Secondary | ICD-10-CM | POA: Diagnosis not present

## 2016-12-23 DIAGNOSIS — E039 Hypothyroidism, unspecified: Secondary | ICD-10-CM | POA: Diagnosis not present

## 2016-12-23 DIAGNOSIS — M1389 Other specified arthritis, multiple sites: Secondary | ICD-10-CM | POA: Diagnosis not present

## 2016-12-23 DIAGNOSIS — E119 Type 2 diabetes mellitus without complications: Secondary | ICD-10-CM | POA: Diagnosis not present

## 2016-12-23 DIAGNOSIS — G2581 Restless legs syndrome: Secondary | ICD-10-CM | POA: Diagnosis not present

## 2016-12-23 DIAGNOSIS — Z6824 Body mass index (BMI) 24.0-24.9, adult: Secondary | ICD-10-CM | POA: Diagnosis not present

## 2016-12-23 DIAGNOSIS — N183 Chronic kidney disease, stage 3 (moderate): Secondary | ICD-10-CM | POA: Diagnosis not present

## 2016-12-23 DIAGNOSIS — J441 Chronic obstructive pulmonary disease with (acute) exacerbation: Secondary | ICD-10-CM | POA: Diagnosis not present

## 2016-12-23 DIAGNOSIS — M7500 Adhesive capsulitis of unspecified shoulder: Secondary | ICD-10-CM | POA: Diagnosis not present

## 2016-12-23 DIAGNOSIS — Z716 Tobacco abuse counseling: Secondary | ICD-10-CM | POA: Diagnosis not present

## 2016-12-23 DIAGNOSIS — I482 Chronic atrial fibrillation: Secondary | ICD-10-CM | POA: Diagnosis not present

## 2016-12-23 DIAGNOSIS — I10 Essential (primary) hypertension: Secondary | ICD-10-CM | POA: Diagnosis not present

## 2016-12-23 DIAGNOSIS — Z0001 Encounter for general adult medical examination with abnormal findings: Secondary | ICD-10-CM | POA: Diagnosis not present

## 2017-03-18 DIAGNOSIS — E119 Type 2 diabetes mellitus without complications: Secondary | ICD-10-CM | POA: Diagnosis not present

## 2017-03-18 DIAGNOSIS — I482 Chronic atrial fibrillation: Secondary | ICD-10-CM | POA: Diagnosis not present

## 2017-03-18 DIAGNOSIS — E1349 Other specified diabetes mellitus with other diabetic neurological complication: Secondary | ICD-10-CM | POA: Diagnosis not present

## 2017-03-18 DIAGNOSIS — M1000 Idiopathic gout, unspecified site: Secondary | ICD-10-CM | POA: Diagnosis not present

## 2017-03-18 DIAGNOSIS — I10 Essential (primary) hypertension: Secondary | ICD-10-CM | POA: Diagnosis not present

## 2017-03-18 DIAGNOSIS — G6289 Other specified polyneuropathies: Secondary | ICD-10-CM | POA: Diagnosis not present

## 2017-03-18 DIAGNOSIS — J441 Chronic obstructive pulmonary disease with (acute) exacerbation: Secondary | ICD-10-CM | POA: Diagnosis not present

## 2017-03-18 DIAGNOSIS — N184 Chronic kidney disease, stage 4 (severe): Secondary | ICD-10-CM | POA: Diagnosis not present

## 2017-03-18 DIAGNOSIS — E039 Hypothyroidism, unspecified: Secondary | ICD-10-CM | POA: Diagnosis not present

## 2017-03-23 ENCOUNTER — Other Ambulatory Visit: Payer: Self-pay | Admitting: Cardiology

## 2017-03-23 DIAGNOSIS — I5033 Acute on chronic diastolic (congestive) heart failure: Secondary | ICD-10-CM | POA: Diagnosis not present

## 2017-03-23 DIAGNOSIS — M7500 Adhesive capsulitis of unspecified shoulder: Secondary | ICD-10-CM | POA: Diagnosis not present

## 2017-03-23 DIAGNOSIS — N184 Chronic kidney disease, stage 4 (severe): Secondary | ICD-10-CM | POA: Diagnosis not present

## 2017-03-23 DIAGNOSIS — I482 Chronic atrial fibrillation: Secondary | ICD-10-CM | POA: Diagnosis not present

## 2017-03-23 DIAGNOSIS — Z6823 Body mass index (BMI) 23.0-23.9, adult: Secondary | ICD-10-CM | POA: Diagnosis not present

## 2017-03-23 DIAGNOSIS — J441 Chronic obstructive pulmonary disease with (acute) exacerbation: Secondary | ICD-10-CM | POA: Diagnosis not present

## 2017-06-14 DIAGNOSIS — I5033 Acute on chronic diastolic (congestive) heart failure: Secondary | ICD-10-CM | POA: Diagnosis not present

## 2017-06-14 DIAGNOSIS — N183 Chronic kidney disease, stage 3 (moderate): Secondary | ICD-10-CM | POA: Diagnosis not present

## 2017-06-14 DIAGNOSIS — J441 Chronic obstructive pulmonary disease with (acute) exacerbation: Secondary | ICD-10-CM | POA: Diagnosis not present

## 2017-06-14 DIAGNOSIS — I482 Chronic atrial fibrillation: Secondary | ICD-10-CM | POA: Diagnosis not present

## 2017-06-14 DIAGNOSIS — E039 Hypothyroidism, unspecified: Secondary | ICD-10-CM | POA: Diagnosis not present

## 2017-06-14 DIAGNOSIS — I10 Essential (primary) hypertension: Secondary | ICD-10-CM | POA: Diagnosis not present

## 2017-06-14 DIAGNOSIS — Z6823 Body mass index (BMI) 23.0-23.9, adult: Secondary | ICD-10-CM | POA: Diagnosis not present

## 2017-06-14 DIAGNOSIS — R634 Abnormal weight loss: Secondary | ICD-10-CM | POA: Diagnosis not present

## 2017-08-22 DIAGNOSIS — Z6823 Body mass index (BMI) 23.0-23.9, adult: Secondary | ICD-10-CM | POA: Diagnosis not present

## 2017-08-22 DIAGNOSIS — E039 Hypothyroidism, unspecified: Secondary | ICD-10-CM | POA: Diagnosis not present

## 2017-08-22 DIAGNOSIS — G4700 Insomnia, unspecified: Secondary | ICD-10-CM | POA: Diagnosis not present

## 2017-08-22 DIAGNOSIS — N184 Chronic kidney disease, stage 4 (severe): Secondary | ICD-10-CM | POA: Diagnosis not present

## 2017-08-22 DIAGNOSIS — R634 Abnormal weight loss: Secondary | ICD-10-CM | POA: Diagnosis not present

## 2017-08-22 DIAGNOSIS — M353 Polymyalgia rheumatica: Secondary | ICD-10-CM | POA: Diagnosis not present

## 2017-08-22 DIAGNOSIS — J441 Chronic obstructive pulmonary disease with (acute) exacerbation: Secondary | ICD-10-CM | POA: Diagnosis not present

## 2017-08-22 DIAGNOSIS — I482 Chronic atrial fibrillation: Secondary | ICD-10-CM | POA: Diagnosis not present

## 2017-10-20 DIAGNOSIS — H25042 Posterior subcapsular polar age-related cataract, left eye: Secondary | ICD-10-CM | POA: Diagnosis not present

## 2017-10-20 DIAGNOSIS — H2512 Age-related nuclear cataract, left eye: Secondary | ICD-10-CM | POA: Diagnosis not present

## 2017-10-20 DIAGNOSIS — H25013 Cortical age-related cataract, bilateral: Secondary | ICD-10-CM | POA: Diagnosis not present

## 2017-10-20 DIAGNOSIS — H2513 Age-related nuclear cataract, bilateral: Secondary | ICD-10-CM | POA: Diagnosis not present

## 2017-10-20 DIAGNOSIS — H35033 Hypertensive retinopathy, bilateral: Secondary | ICD-10-CM | POA: Diagnosis not present

## 2017-10-20 DIAGNOSIS — H35363 Drusen (degenerative) of macula, bilateral: Secondary | ICD-10-CM | POA: Diagnosis not present

## 2017-10-20 DIAGNOSIS — H25012 Cortical age-related cataract, left eye: Secondary | ICD-10-CM | POA: Diagnosis not present

## 2017-10-24 ENCOUNTER — Other Ambulatory Visit: Payer: Self-pay | Admitting: Cardiology

## 2017-11-01 DIAGNOSIS — H25812 Combined forms of age-related cataract, left eye: Secondary | ICD-10-CM | POA: Diagnosis not present

## 2017-11-01 DIAGNOSIS — H2512 Age-related nuclear cataract, left eye: Secondary | ICD-10-CM | POA: Diagnosis not present

## 2017-11-01 DIAGNOSIS — H5703 Miosis: Secondary | ICD-10-CM | POA: Diagnosis not present

## 2017-11-02 DIAGNOSIS — H029 Unspecified disorder of eyelid: Secondary | ICD-10-CM | POA: Diagnosis not present

## 2017-11-08 ENCOUNTER — Other Ambulatory Visit: Payer: Self-pay | Admitting: Cardiology

## 2017-11-11 DIAGNOSIS — J441 Chronic obstructive pulmonary disease with (acute) exacerbation: Secondary | ICD-10-CM | POA: Diagnosis not present

## 2017-11-11 DIAGNOSIS — M7500 Adhesive capsulitis of unspecified shoulder: Secondary | ICD-10-CM | POA: Diagnosis not present

## 2017-11-11 DIAGNOSIS — E039 Hypothyroidism, unspecified: Secondary | ICD-10-CM | POA: Diagnosis not present

## 2017-11-11 DIAGNOSIS — Z6822 Body mass index (BMI) 22.0-22.9, adult: Secondary | ICD-10-CM | POA: Diagnosis not present

## 2017-11-11 DIAGNOSIS — R634 Abnormal weight loss: Secondary | ICD-10-CM | POA: Diagnosis not present

## 2017-11-11 DIAGNOSIS — N184 Chronic kidney disease, stage 4 (severe): Secondary | ICD-10-CM | POA: Diagnosis not present

## 2017-11-11 DIAGNOSIS — R609 Edema, unspecified: Secondary | ICD-10-CM | POA: Diagnosis not present

## 2017-11-14 DIAGNOSIS — H25011 Cortical age-related cataract, right eye: Secondary | ICD-10-CM | POA: Diagnosis not present

## 2017-11-14 DIAGNOSIS — H2511 Age-related nuclear cataract, right eye: Secondary | ICD-10-CM | POA: Diagnosis not present

## 2017-11-22 DIAGNOSIS — H2511 Age-related nuclear cataract, right eye: Secondary | ICD-10-CM | POA: Diagnosis not present

## 2017-11-22 DIAGNOSIS — H5703 Miosis: Secondary | ICD-10-CM | POA: Diagnosis not present

## 2017-11-22 DIAGNOSIS — H25811 Combined forms of age-related cataract, right eye: Secondary | ICD-10-CM | POA: Diagnosis not present

## 2017-11-28 ENCOUNTER — Other Ambulatory Visit: Payer: Self-pay | Admitting: Cardiology

## 2018-01-09 ENCOUNTER — Encounter (HOSPITAL_COMMUNITY): Payer: Self-pay | Admitting: Emergency Medicine

## 2018-01-09 ENCOUNTER — Emergency Department (HOSPITAL_COMMUNITY): Payer: Medicare Other

## 2018-01-09 ENCOUNTER — Other Ambulatory Visit: Payer: Self-pay

## 2018-01-09 ENCOUNTER — Inpatient Hospital Stay (HOSPITAL_COMMUNITY)
Admission: EM | Admit: 2018-01-09 | Discharge: 2018-01-11 | DRG: 193 | Disposition: A | Discharge status: Home Health | Payer: Medicare Other | Attending: Internal Medicine | Admitting: Internal Medicine

## 2018-01-09 DIAGNOSIS — D649 Anemia, unspecified: Secondary | ICD-10-CM | POA: Diagnosis present

## 2018-01-09 DIAGNOSIS — I10 Essential (primary) hypertension: Secondary | ICD-10-CM | POA: Diagnosis present

## 2018-01-09 DIAGNOSIS — Z7952 Long term (current) use of systemic steroids: Secondary | ICD-10-CM | POA: Diagnosis not present

## 2018-01-09 DIAGNOSIS — J9600 Acute respiratory failure, unspecified whether with hypoxia or hypercapnia: Secondary | ICD-10-CM | POA: Diagnosis not present

## 2018-01-09 DIAGNOSIS — I480 Paroxysmal atrial fibrillation: Secondary | ICD-10-CM | POA: Diagnosis present

## 2018-01-09 DIAGNOSIS — Z8249 Family history of ischemic heart disease and other diseases of the circulatory system: Secondary | ICD-10-CM

## 2018-01-09 DIAGNOSIS — Z720 Tobacco use: Secondary | ICD-10-CM

## 2018-01-09 DIAGNOSIS — J410 Simple chronic bronchitis: Secondary | ICD-10-CM | POA: Diagnosis present

## 2018-01-09 DIAGNOSIS — E860 Dehydration: Secondary | ICD-10-CM | POA: Diagnosis present

## 2018-01-09 DIAGNOSIS — R5381 Other malaise: Secondary | ICD-10-CM | POA: Diagnosis not present

## 2018-01-09 DIAGNOSIS — Z79890 Hormone replacement therapy (postmenopausal): Secondary | ICD-10-CM

## 2018-01-09 DIAGNOSIS — I130 Hypertensive heart and chronic kidney disease with heart failure and stage 1 through stage 4 chronic kidney disease, or unspecified chronic kidney disease: Secondary | ICD-10-CM | POA: Diagnosis present

## 2018-01-09 DIAGNOSIS — R918 Other nonspecific abnormal finding of lung field: Secondary | ICD-10-CM | POA: Diagnosis not present

## 2018-01-09 DIAGNOSIS — J9601 Acute respiratory failure with hypoxia: Secondary | ICD-10-CM | POA: Diagnosis present

## 2018-01-09 DIAGNOSIS — L8990 Pressure ulcer of unspecified site, unspecified stage: Secondary | ICD-10-CM

## 2018-01-09 DIAGNOSIS — E1122 Type 2 diabetes mellitus with diabetic chronic kidney disease: Secondary | ICD-10-CM | POA: Diagnosis present

## 2018-01-09 DIAGNOSIS — I959 Hypotension, unspecified: Secondary | ICD-10-CM | POA: Diagnosis present

## 2018-01-09 DIAGNOSIS — E1142 Type 2 diabetes mellitus with diabetic polyneuropathy: Secondary | ICD-10-CM | POA: Diagnosis present

## 2018-01-09 DIAGNOSIS — Z7982 Long term (current) use of aspirin: Secondary | ICD-10-CM

## 2018-01-09 DIAGNOSIS — N184 Chronic kidney disease, stage 4 (severe): Secondary | ICD-10-CM | POA: Diagnosis present

## 2018-01-09 DIAGNOSIS — Z7984 Long term (current) use of oral hypoglycemic drugs: Secondary | ICD-10-CM

## 2018-01-09 DIAGNOSIS — F17210 Nicotine dependence, cigarettes, uncomplicated: Secondary | ICD-10-CM | POA: Diagnosis present

## 2018-01-09 DIAGNOSIS — L89152 Pressure ulcer of sacral region, stage 2: Secondary | ICD-10-CM | POA: Diagnosis present

## 2018-01-09 DIAGNOSIS — J13 Pneumonia due to Streptococcus pneumoniae: Principal | ICD-10-CM | POA: Diagnosis present

## 2018-01-09 DIAGNOSIS — K625 Hemorrhage of anus and rectum: Secondary | ICD-10-CM | POA: Diagnosis present

## 2018-01-09 DIAGNOSIS — J189 Pneumonia, unspecified organism: Secondary | ICD-10-CM | POA: Diagnosis present

## 2018-01-09 DIAGNOSIS — R21 Rash and other nonspecific skin eruption: Secondary | ICD-10-CM

## 2018-01-09 DIAGNOSIS — I5032 Chronic diastolic (congestive) heart failure: Secondary | ICD-10-CM | POA: Diagnosis present

## 2018-01-09 DIAGNOSIS — I4891 Unspecified atrial fibrillation: Secondary | ICD-10-CM | POA: Diagnosis present

## 2018-01-09 DIAGNOSIS — Z79899 Other long term (current) drug therapy: Secondary | ICD-10-CM

## 2018-01-09 DIAGNOSIS — E119 Type 2 diabetes mellitus without complications: Secondary | ICD-10-CM

## 2018-01-09 LAB — HEPATIC FUNCTION PANEL
ALBUMIN: 2.8 g/dL — AB (ref 3.5–5.0)
ALT: 12 U/L (ref 0–44)
AST: 19 U/L (ref 15–41)
Alkaline Phosphatase: 75 U/L (ref 38–126)
Bilirubin, Direct: 0.3 mg/dL — ABNORMAL HIGH (ref 0.0–0.2)
Indirect Bilirubin: 0.5 mg/dL (ref 0.3–0.9)
Total Bilirubin: 0.8 mg/dL (ref 0.3–1.2)
Total Protein: 6.6 g/dL (ref 6.5–8.1)

## 2018-01-09 LAB — GLUCOSE, CAPILLARY: Glucose-Capillary: 169 mg/dL — ABNORMAL HIGH (ref 70–99)

## 2018-01-09 LAB — URINALYSIS, ROUTINE W REFLEX MICROSCOPIC
Bilirubin Urine: NEGATIVE
Glucose, UA: NEGATIVE mg/dL
Hgb urine dipstick: NEGATIVE
KETONES UR: NEGATIVE mg/dL
Leukocytes, UA: NEGATIVE
Nitrite: NEGATIVE
Protein, ur: NEGATIVE mg/dL
Specific Gravity, Urine: 1.01 (ref 1.005–1.030)
pH: 7 (ref 5.0–8.0)

## 2018-01-09 LAB — CBC WITH DIFFERENTIAL/PLATELET
Abs Immature Granulocytes: 0.18 10*3/uL — ABNORMAL HIGH (ref 0.00–0.07)
BASOS PCT: 0 %
Basophils Absolute: 0.1 10*3/uL (ref 0.0–0.1)
EOS ABS: 0.1 10*3/uL (ref 0.0–0.5)
Eosinophils Relative: 1 %
HCT: 38 % — ABNORMAL LOW (ref 39.0–52.0)
Hemoglobin: 12.2 g/dL — ABNORMAL LOW (ref 13.0–17.0)
Immature Granulocytes: 1 %
Lymphocytes Relative: 13 %
Lymphs Abs: 1.8 10*3/uL (ref 0.7–4.0)
MCH: 30.7 pg (ref 26.0–34.0)
MCHC: 32.1 g/dL (ref 30.0–36.0)
MCV: 95.7 fL (ref 80.0–100.0)
Monocytes Absolute: 0.7 10*3/uL (ref 0.1–1.0)
Monocytes Relative: 5 %
Neutro Abs: 10.6 10*3/uL — ABNORMAL HIGH (ref 1.7–7.7)
Neutrophils Relative %: 80 %
PLATELETS: 269 10*3/uL (ref 150–400)
RBC: 3.97 MIL/uL — ABNORMAL LOW (ref 4.22–5.81)
RDW: 14.5 % (ref 11.5–15.5)
WBC: 13.4 10*3/uL — ABNORMAL HIGH (ref 4.0–10.5)
nRBC: 0 % (ref 0.0–0.2)

## 2018-01-09 LAB — BASIC METABOLIC PANEL
ANION GAP: 10 (ref 5–15)
BUN: 37 mg/dL — ABNORMAL HIGH (ref 8–23)
CALCIUM: 10.6 mg/dL — AB (ref 8.9–10.3)
CO2: 27 mmol/L (ref 22–32)
Chloride: 99 mmol/L (ref 98–111)
Creatinine, Ser: 2.29 mg/dL — ABNORMAL HIGH (ref 0.61–1.24)
GFR calc Af Amer: 30 mL/min — ABNORMAL LOW (ref >60)
GFR calc non Af Amer: 26 mL/min — ABNORMAL LOW (ref >60)
Glucose, Bld: 194 mg/dL — ABNORMAL HIGH (ref 70–99)
Potassium: 4.8 mmol/L (ref 3.5–5.1)
Sodium: 136 mmol/L (ref 135–145)

## 2018-01-09 LAB — I-STAT CG4 LACTIC ACID, ED: LACTIC ACID, VENOUS: 1.02 mmol/L (ref 0.5–1.9)

## 2018-01-09 LAB — OCCULT BLOOD, POC DEVICE: Fecal Occult Bld: NEGATIVE

## 2018-01-09 MED ORDER — ACETAMINOPHEN 325 MG PO TABS
650.0000 mg | ORAL_TABLET | Freq: Four times a day (QID) | ORAL | Status: DC | PRN
  Administered 2018-01-10 – 2018-01-11 (×2): 650 mg via ORAL
  Filled 2018-01-09 (×2): qty 2

## 2018-01-09 MED ORDER — ONDANSETRON HCL 4 MG/2ML IJ SOLN: 4.0000 mg | Freq: Four times a day (QID) | INTRAMUSCULAR | Status: DC | PRN

## 2018-01-09 MED ORDER — SODIUM CHLORIDE 0.9 % IV BOLUS (SEPSIS)
1000.0000 mL | Freq: Once | INTRAVENOUS | Status: AC
  Administered 2018-01-09: 1000 mL via INTRAVENOUS

## 2018-01-09 MED ORDER — SODIUM CHLORIDE 0.9 % IV BOLUS (SEPSIS)
250.0000 mL | Freq: Once | INTRAVENOUS | Status: AC
  Administered 2018-01-09: 250 mL via INTRAVENOUS

## 2018-01-09 MED ORDER — SODIUM CHLORIDE 0.9 % IV SOLN
500.0000 mg | INTRAVENOUS | Status: DC
  Administered 2018-01-09: 500 mg via INTRAVENOUS
  Filled 2018-01-09: qty 500

## 2018-01-09 MED ORDER — SODIUM CHLORIDE 0.45 % IV SOLN
INTRAVENOUS | Status: AC
  Administered 2018-01-10: 01:00:00 via INTRAVENOUS

## 2018-01-09 MED ORDER — LEVALBUTEROL HCL 0.63 MG/3ML IN NEBU: 0.6300 mg | INHALATION_SOLUTION | Freq: Four times a day (QID) | RESPIRATORY_TRACT | Status: DC | PRN

## 2018-01-09 MED ORDER — SODIUM CHLORIDE 0.9 % IV SOLN
2.0000 g | INTRAVENOUS | Status: DC
  Administered 2018-01-09 – 2018-01-10 (×2): 2 g via INTRAVENOUS
  Filled 2018-01-09: qty 20
  Filled 2018-01-09: qty 2
  Filled 2018-01-09: qty 20

## 2018-01-09 MED ORDER — ENOXAPARIN SODIUM 30 MG/0.3ML ~~LOC~~ SOLN: 30.0000 mg | SUBCUTANEOUS | Status: DC

## 2018-01-09 MED ORDER — ACETAMINOPHEN 650 MG RE SUPP: 650.0000 mg | Freq: Four times a day (QID) | RECTAL | Status: DC | PRN

## 2018-01-09 MED ORDER — SODIUM CHLORIDE 0.45 % IV SOLN: INTRAVENOUS | Status: DC

## 2018-01-09 MED ORDER — ONDANSETRON HCL 4 MG PO TABS: 4.0000 mg | ORAL_TABLET | Freq: Four times a day (QID) | ORAL | Status: DC | PRN

## 2018-01-09 MED ORDER — IPRATROPIUM BROMIDE 0.02 % IN SOLN
0.5000 mg | Freq: Four times a day (QID) | RESPIRATORY_TRACT | Status: DC
  Administered 2018-01-10 – 2018-01-11 (×6): 0.5 mg via RESPIRATORY_TRACT
  Filled 2018-01-09 (×7): qty 2.5

## 2018-01-09 MED ORDER — LEVALBUTEROL HCL 0.63 MG/3ML IN NEBU
0.6300 mg | INHALATION_SOLUTION | Freq: Four times a day (QID) | RESPIRATORY_TRACT | Status: DC
  Administered 2018-01-10 – 2018-01-11 (×6): 0.63 mg via RESPIRATORY_TRACT
  Filled 2018-01-09 (×7): qty 3

## 2018-01-09 NOTE — ED Notes (Addendum)
Placed pt on 2L/min via [place] Pt's O2 dropped to 88%

## 2018-01-09 NOTE — H&P (Signed)
History and Physical    Dylan Williamson:[id number] DOB: 10-08-1934 DOA: 01/09/2018  PCP: Rory Percy, MD  Patient coming from: Home.  I have personally briefly reviewed patient's old medical records in [place]  Chief Complaint: Decreased appetite and weakness.  HPI: Dylan Williamson is a 84 y.o. male with medical history significant of type 2 diabetes, hypertension, diabetic peripheral neuropathy, history stage IV CKD with history of acute renal failure episode, paroxysmal atrial fibrillation, normocytic anemia, history of H CAP who is coming to the emergency department with complaints of creased appetite and oral intake associated with weakness for the past week.  He denies fever, but complains of chills, fatigue, decreased appetite and weakness.  No rhinorrhea, sore throat, productive cough, hemoptysis, but states he has been having wheezing.  Denies chest pain, palpitations, dizziness, diaphoresis, PND or orthopnea.  He denies abdominal pain, nausea, emesis, diarrhea, constipation or melena.  He reports having frequent hematochezia.  He denies dysuria, frequency or hematuria.  No polyuria, polydipsia, polyphagia or blurred vision.  ED Course: Initial vital signs temperature 98.2 F, pulse 89, respirations 24, blood pressure 96/67 mmHg and O2 sat 94% on room air.  He received ceftriaxone and Zithromax in the emergency department.    Blood cultures x2 were drawn.  His urinalysis was normal.  Fecal occult blood was normal.  Lactic acid was normal.  White count is 13.4, with 80% neutrophils, 13% lymphocytes and 5% monocytes.  Hemoglobin 12.2 g/dL and platelets 269.  BMP shows a glucose of 194, BUN 37, creatinine 2.29 and calcium 10.6 mg/dL.  Sodium, potassium, chloride and CO2 are within normal limits.His chest radiograph shows a mild reticulonodular opacity in the right greater than left lower lung, suspicious for pneumonia or atypical pneumonia.  Please see images and  full radiology report for further detail.  Review of Systems: As per HPI otherwise 10 point review of systems negative.   Past Medical History:  Diagnosis Date  . Acute renal failure ([place])   . Diabetes mellitus without complication ([place])   . Hypertension   . Neuropathy     Past Surgical History:  Procedure Laterality Date  . NO PAST SURGERIES       reports that he has been smoking cigarettes and e-cigarettes. He started smoking about 70 years ago. He has been smoking about 1.00 pack per day. He has never used smokeless tobacco. He reports previous alcohol use. He reports that he does not use drugs.  No Known Allergies  Family History  Problem Relation Age of Onset  . Heart disease Sister   . Pneumonia Brother    Prior to Admission medications   Medication Sig Start Date End Date Taking? Authorizing Provider  amiodarone (PACERONE) 200 MG tablet TAKE 1/2 TABLET BY MOUTH DAILY. Patient taking differently: Take 100 mg by mouth daily.  11/28/17  Yes BranchAlphonse Guild, MD  aspirin EC 81 MG tablet Take 1 tablet (81 mg total) by mouth daily. 10/20/15  Yes BranchAlphonse Guild, MD  cholecalciferol (VITAMIN D) 1000 units tablet Take 1,000 Units by mouth daily.   Yes [provider]  furosemide (LASIX) 40 MG tablet Take 40 mg by mouth 2 (two) times daily.    Yes [provider]  gabapentin (NEURONTIN) 800 MG tablet Take 800-1,600 mg by mouth See admin instructions. 800mg  in the morning and 1600mg  in the evneing   Yes [provider]  glyBURIDE (DIABETA) 5 MG tablet Take 10 mg by mouth 2 (  two) times daily with a meal.    Yes [provider]  HYDROcodone-acetaminophen (NORCO/VICODIN) 5-325 MG tablet Take 1 tablet by mouth 2 (two) times daily. 09/16/16  Yes [provider]  levothyroxine (SYNTHROID, LEVOTHROID) 75 MCG tablet Take 75 mcg by mouth every morning. 09/16/16  Yes [provider]  Multiple Vitamins-Minerals (MULTIVITAMIN WITH  MINERALS) tablet Take 1 tablet by mouth daily.   Yes [provider]  predniSONE (DELTASONE) 10 MG tablet Take 10 mg by mouth daily.    Yes [provider]    Physical Exam: Vitals:   01/09/18 2030 01/09/18 2100 01/09/18 2200 01/09/18 2230  BP: (!) 127/96 117/70 116/78 111/73  Pulse: 91 84 84 81  Resp: (!) 22 (!) 22 20 19   Temp:      TempSrc:      SpO2: 98% 96% 99% 98%  Weight:      Height:        Constitutional: NAD, calm, comfortable Eyes: PERRL, lids and conjunctivae normal ENMT: Mucous membranes are moist. Posterior pharynx clear of any exudate or lesions. Neck: normal, supple, no masses, no thyromegaly Respiratory: Mildly decreased breath sounds with bilateral rhonchi with wheezing, no crackles. Normal respiratory effort. No accessory muscle use.  Cardiovascular: Regular rate and rhythm, no murmurs / rubs / gallops. No extremity edema. 2+ pedal pulses. No carotid bruits.  Abdomen: Soft, no tenderness, no masses palpated. No hepatosplenomegaly. Bowel sounds positive.  Musculoskeletal: no clubbing / cyanosis.  Good ROM, no contractures. Normal muscle tone.  Skin: Small stage II pressure ulcer on sacral area.  Positive heel stage II ulcer.  Right thumb and right index finger showed distal healing ulcers. Neurologic: CN 2-12 grossly intact. Sensation intact, DTR normal. Strength 5/5 in all 4.  Psychiatric: Normal judgment and insight. Alert and oriented x 3. Normal mood.   Labs on Admission: I have personally reviewed following labs and imaging studies  CBC: Recent Labs  Lab 01/09/18 1918  WBC 13.4*  NEUTROABS 10.6*  HGB 12.2*  HCT 38.0*  MCV 95.7  PLT 259   Basic Metabolic Panel: Recent Labs  Lab 01/09/18 1918  NA 136  K 4.8  CL 99  CO2 27  GLUCOSE 194*  BUN 37*  CREATININE 2.29*  CALCIUM 10.6*   GFR: Estimated Creatinine Clearance: 26.8 mL/min (A) (by C-G formula based on SCr of 2.29 mg/dL (H)). Liver Function Tests: No results for  input(s): AST, ALT, ALKPHOS, BILITOT, PROT, ALBUMIN in the last 168 hours. No results for input(s): LIPASE, AMYLASE in the last 168 hours. No results for input(s): AMMONIA in the last 168 hours. Coagulation Profile: No results for input(s): INR, PROTIME in the last 168 hours. Cardiac Enzymes: No results for input(s): CKTOTAL, CKMB, CKMBINDEX, TROPONINI in the last 168 hours. BNP (last 3 results) No results for input(s): PROBNP in the last 8760 hours. HbA1C: No results for input(s): HGBA1C in the last 72 hours. CBG: Recent Labs  Lab 01/09/18 2011  GLUCAP 169*   Lipid Profile: No results for input(s): CHOL, HDL, LDLCALC, TRIG, CHOLHDL, LDLDIRECT in the last 72 hours. Thyroid Function Tests: No results for input(s): TSH, T4TOTAL, FREET4, T3FREE, THYROIDAB in the last 72 hours. Anemia Panel: No results for input(s): VITAMINB12, FOLATE, FERRITIN, TIBC, IRON, RETICCTPCT in the last 72 hours. Urine analysis: No results found for: COLORURINE, APPEARANCEUR, LABSPEC, PHURINE, GLUCOSEU, HGBUR, BILIRUBINUR, KETONESUR, PROTEINUR, UROBILINOGEN, NITRITE, LEUKOCYTESUR  Radiological Exams on Admission: Dg Chest 2 View  Result Date: 01/09/2018 CLINICAL DATA:  Fatigue loss  of appetite EXAM: CHEST - 2 VIEW COMPARISON:  06/23/2015 FINDINGS: Coarse reticulonodular opacity in the right greater than left lower lung zones. No significant pleural effusion. Normal heart size. No pneumothorax. IMPRESSION: Mild reticulonodular opacity in the right greater than left lower lungs, suspicious pneumonia, possible atypical pneumonia. Negative for consolidation or pleural effusion. Electronically Signed   By: Donavan Foil M.D.   On: 01/09/2018 19:06    EKG: Independently reviewed. Vent. rate 83 BPM PR interval * ms QRS duration 101 ms QT/QTc 399/469 ms P-R-T axes -68 -87 67 Ectopic atrial rhythm Paired ventricular premature complexes Incomplete RBBB and LAFB Low voltage, precordial leads Right ventricular  hypertrophy Baseline wander in lead(s) V3 V4 When compared to previous, ectopic rhythm is now present.  Assessment/Plan Principal Problem:   CAP (community acquired pneumonia) Admit to telemetry/inpatient. Continue supplemental oxygen. Xopenex plus ipratropium every 6 hours. PRN Xopenex every 6 hours. Continue azithromycin 500 mg IVPB every 24 hours. Continue ceftriaxone 1 g IVPB every 24 hours. Check strep pneumonia urinary antigen. Follow up BC&S. Check sputum Gram stain, culture and sensitivity.  Active Problems:   Normocytic anemia Anemia panel in 2017 showed decrease iron and ferritin. Monitor hematocrit and hemoglobin. May benefit from iron supplementation.    Hypertension Resume furosemide tomorrow evening. Monitor BP, renal function and electrolytes. May benefit from addition of ACE inhibitor.    Atrial fibrillation (HCC) CHA?DS?-VASc Score of at least 5. Not on anticoagulation due to risk of bleeding and falls. Continue amiodarone 100 mg p.o. daily.    DM type 2 (diabetes mellitus, type 2) (HCC) Carbohydrate modified diet. Continue DiaBeta 10 mg p.o. twice daily. CBG monitoring regular insulin sliding scale.    CKD (chronic kidney disease) stage 4, GFR 15-29 ml/min (HCC) Monitor renal function and electrolytes.    Chronic diastolic heart failure (HCC) No signs of decompensation. Continue furosemide. Consider ACE inhibitor and beta-blocker.    Pressure injury of the skin Stage II sacral area pressure injury. Continue local care.   DVT prophylaxis: Lovenox SQ. Code Status: Full code. Family Communication: His daughter was present in the ED room. Disposition Plan: Admit for IV antibiotics for 2 to 3 days. Consults called: Admission status: Inpatient/telemetry.   Reubin Milan MD [facility]  If 7PM-7AM, please contact night-coverage [email or web address] Password [email or web address]  01/09/2018, 11:00 PM

## 2018-01-09 NOTE — ED Triage Notes (Signed)
C/o complete loss of appetite and decreased intake of fluids x 1week.  Denies any pain at this time. Reports seeing some blood in his stools.

## 2018-01-09 NOTE — ED Provider Notes (Signed)
[facility] EMERGENCY DEPARTMENT Provider Note   CSN: [id number] Arrival date & time: 01/09/18  1807     History   Chief Complaint Chief Complaint  Patient presents with  . Anorexia    HPI Dylan Williamson is a 84 y.o. male.  HPI   He is here for evaluation of decreased oral intake with lack of hunger, for 2 weeks.  He denies persistent nausea or vomiting.  He did have a episode of vomiting several days ago.  He has noticed some blood in his underwear, and has been wearing a pad.  He thinks he is also seen some blood when he has a bowel movement.  He denies fever, chills, change in urinary habits.  He has a chronic smoker's cough.  He continues to smoke.  He is not producing much sputum at this time.  He has not seen any hemoptysis.  He is taking his usual medications.  He is here with his daughter, presenting by private vehicle from his home.  There are no other known modifying factors.   Past Medical History:  Diagnosis Date  . Acute renal failure ([place])   . Diabetes mellitus without complication ([place])   . Hypertension   . Neuropathy     Patient Active Problem List   Diagnosis Date Noted  . Acute diastolic CHF (congestive heart failure) ([place]) 06/18/2015  . CKD (chronic kidney disease) stage 4, GFR 15-29 ml/min (HCC) 06/18/2015  . Healthcare-associated pneumonia 06/17/2015  . Acute respiratory failure with hypoxia ([place]) 06/17/2015  . Acute pulmonary edema ([place]) 06/17/2015  . DM type 2 (diabetes mellitus, type 2) ([place]) 06/17/2015  . Atrial fibrillation ([place]) 06/11/2015  . Atrial flutter ([place]) 06/03/2015  . Syncope 06/03/2015  . Kidney disease 06/03/2015  . Normocytic anemia 06/03/2015  . Hypertension 06/03/2015  . Non-insulin dependent type 2 diabetes mellitus ([place]) 06/03/2015  . Insomnia 06/03/2015    Past Surgical History:  Procedure Laterality Date  . NO PAST SURGERIES          Home Medications    Prior to Admission medications   Medication Sig Start  Date End Date Taking? Authorizing Provider  amiodarone (PACERONE) 200 MG tablet TAKE 1/2 TABLET BY MOUTH DAILY. Patient taking differently: Take 100 mg by mouth daily.  11/28/17  Yes BranchAlphonse Guild, MD  aspirin EC 81 MG tablet Take 1 tablet (81 mg total) by mouth daily. 10/20/15  Yes BranchAlphonse Guild, MD  cholecalciferol (VITAMIN D) 1000 units tablet Take 1,000 Units by mouth daily.   Yes [provider]  furosemide (LASIX) 40 MG tablet Take 40 mg by mouth 2 (two) times daily.    Yes [provider]  gabapentin (NEURONTIN) 800 MG tablet Take 800-1,600 mg by mouth See admin instructions. 800mg  in the morning and 1600mg  in the evneing   Yes [provider]  glyBURIDE (DIABETA) 5 MG tablet Take 10 mg by mouth 2 (two) times daily with a meal.    Yes [provider]  HYDROcodone-acetaminophen (NORCO/VICODIN) 5-325 MG tablet Take 1 tablet by mouth 2 (two) times daily. 09/16/16  Yes [provider]  levothyroxine (SYNTHROID, LEVOTHROID) 75 MCG tablet Take 75 mcg by mouth every morning. 09/16/16  Yes [provider]  Multiple Vitamins-Minerals (MULTIVITAMIN WITH MINERALS) tablet Take 1 tablet by mouth daily.   Yes [provider]  predniSONE (DELTASONE) 10 MG tablet Take 10 mg by mouth daily.    Yes [provider]    Family History Family  History  Problem Relation Age of Onset  . Heart disease Sister   . Pneumonia Brother     Social History Social History   Tobacco Use  . Smoking status: Current Every Day Smoker    Packs/day: 1.00    Types: Cigarettes, E-cigarettes    Start date: 08/30/1945  . Smokeless tobacco: Never Used  . Tobacco comment: e-cigg x 1 month  Substance Use Topics  . Alcohol use: Not Currently    Alcohol/week: 0.0 standard drinks    Comment: patient restarted smoking in September 2017  . Drug use: No     Allergies   Patient has no known allergies.   Review of Systems Review of Systems    All other systems reviewed and are negative.    Physical Exam Updated Vital Signs BP 116/78   Pulse 84   Temp 97.7 F (36.5 C) (Rectal)   Resp 20   Ht 6\' 2"  (1.88 m)   Wt 74.8 kg   SpO2 99%   BMI 21.18 kg/m   Physical Exam Vitals signs and nursing note reviewed.  Constitutional:      General: He is not in acute distress.    Appearance: He is well-developed and normal weight. He is not ill-appearing or toxic-appearing.     Comments: Elderly, frail  HENT:     Head: Normocephalic and atraumatic.     Right Ear: External ear normal.     Left Ear: External ear normal.     Nose: No congestion or rhinorrhea.     Mouth/Throat:     Mouth: Mucous membranes are dry.     Comments: Questionable thrush, right cheek and tongue.  Small white patches, without significant erythema or swelling. Eyes:     General:        Right eye: No discharge.        Left eye: No discharge.     Conjunctiva/sclera: Conjunctivae normal.     Pupils: Pupils are equal, round, and reactive to light.  Neck:     Musculoskeletal: Normal range of motion and neck supple. No neck rigidity or muscular tenderness.     Trachea: Phonation normal.  Cardiovascular:     Rate and Rhythm: Normal rate and regular rhythm.     Heart sounds: Normal heart sounds.  Pulmonary:     Effort: Pulmonary effort is normal.     Breath sounds: Normal breath sounds.  Abdominal:     Palpations: Abdomen is soft.     Tenderness: There is no abdominal tenderness.  Genitourinary:    Comments: Normal anus, small amount of soft brown stool in rectal vault.  Bilateral buttocks rash, with some excoriation and early skin breakdown of the right buttocks, not adjacent to the anus. Musculoskeletal: Normal range of motion.        General: No swelling or tenderness.  Skin:    General: Skin is warm and dry.     Coloration: Skin is not pale.     Findings: No erythema.     Comments: Buttocks rash as described above.  Neurological:     Mental  Status: He is alert and oriented to person, place, and time.     Cranial Nerves: No cranial nerve deficit.     Sensory: No sensory deficit.     Motor: No abnormal muscle tone.     Coordination: Coordination normal.  Psychiatric:        Mood and Affect: Mood normal.        Behavior: Behavior  normal.        Thought Content: Thought content normal.        Judgment: Judgment normal.      ED Treatments / Results  Labs (all labs ordered are listed, but only abnormal results are displayed) Labs Reviewed  BASIC METABOLIC PANEL - Abnormal; Notable for the following components:      Result Value   Glucose, Bld 194 (*)    BUN 37 (*)    Creatinine, Ser 2.29 (*)    Calcium 10.6 (*)    GFR calc non Af Amer 26 (*)    GFR calc Af Amer 30 (*)    All other components within normal limits  CBC WITH DIFFERENTIAL/PLATELET - Abnormal; Notable for the following components:   WBC 13.4 (*)    RBC 3.97 (*)    Hemoglobin 12.2 (*)    HCT 38.0 (*)    Neutro Abs 10.6 (*)    Abs Immature Granulocytes 0.18 (*)    All other components within normal limits  GLUCOSE, CAPILLARY - Abnormal; Notable for the following components:   Glucose-Capillary 169 (*)    All other components within normal limits  CULTURE, BLOOD (ROUTINE X 2)  CULTURE, BLOOD (ROUTINE X 2)  URINALYSIS, ROUTINE W REFLEX MICROSCOPIC  CBG MONITORING, ED  I-STAT CG4 LACTIC ACID, ED  POC OCCULT BLOOD, ED    EKG EKG Interpretation  Date/Time:  Monday January 09 2018 20:04:32 EST Ventricular Rate:  83 PR Interval:    QRS Duration: 101 QT Interval:  399 QTC Calculation: 469 R Axis:   -87 Text Interpretation:  Ectopic atrial rhythm Paired ventricular premature complexes Incomplete RBBB and LAFB Low voltage, precordial leads Right ventricular hypertrophy Baseline wander in lead(s) V3 V4 Since last tracing atrial rhythym is now present and R axis has shifted  Confirmed by Daleen Bo [phone]) on 01/09/2018 8:15:10 PM   Radiology Dg  Chest 2 View  Result Date: 01/09/2018 CLINICAL DATA:  Fatigue loss of appetite EXAM: CHEST - 2 VIEW COMPARISON:  06/23/2015 FINDINGS: Coarse reticulonodular opacity in the right greater than left lower lung zones. No significant pleural effusion. Normal heart size. No pneumothorax. IMPRESSION: Mild reticulonodular opacity in the right greater than left lower lungs, suspicious pneumonia, possible atypical pneumonia. Negative for consolidation or pleural effusion. Electronically Signed   By: Donavan Foil M.D.   On: 01/09/2018 19:06    Procedures .Critical Care Performed by: Daleen Bo, MD Authorized by: Daleen Bo, MD   Critical care provider statement:    Critical care time (minutes):  35   Critical care start time:  01/09/2018 7:55 PM   Critical care end time:  01/09/2018 10:33 PM   Critical care time was exclusive of:  Separately billable procedures and treating other patients   Critical care was necessary to treat or prevent imminent or life-threatening deterioration of the following conditions:  Respiratory failure   Critical care was time spent personally by me on the following activities:  Blood draw for specimens, development of treatment plan with patient or surrogate, discussions with consultants, evaluation of patient's response to treatment, examination of patient, obtaining history from patient or surrogate, ordering and performing treatments and interventions, ordering and review of laboratory studies, pulse oximetry, re-evaluation of patient's condition, review of old charts and ordering and review of radiographic studies   (including critical care time)  Medications Ordered in ED Medications  cefTRIAXone (ROCEPHIN) 2 g in sodium chloride 0.9 % 100 mL IVPB (0 g Intravenous Stopped 01/09/18  2129)  azithromycin (ZITHROMAX) 500 mg in sodium chloride 0.9 % 250 mL IVPB (500 mg Intravenous New Bag/Given 01/09/18 2141)  sodium chloride 0.9 % bolus 1,000 mL (0 mLs Intravenous Stopped  01/09/18 2206)    And  sodium chloride 0.9 % bolus 1,000 mL (0 mLs Intravenous Stopped 01/09/18 2233)    And  sodium chloride 0.9 % bolus 250 mL (0 mLs Intravenous Stopped 01/09/18 2233)     Initial Impression / Assessment and Plan / ED Course  I have reviewed the triage vital signs and the nursing notes.  Pertinent labs & imaging results that were available during my care of the patient were reviewed by me and considered in my medical decision making (see chart for details).  Clinical Course as of Jan 10 2231  Mon Jan 09, 2018  2008 Images reviewed by me, consistent with lower lobe atypical pneumonia, right greater than left.  DG Chest 2 View [EW]  2009 Normal except white count high, hemoglobin low   [EW]  2009 Normal except glucose high, BUN high, creatinine high, calcium high, GFR low  Basic metabolic panel(!) [EW]  1103 Normal except white count high, hemoglobin low  CBC with Differential(!) [EW]    Clinical Course User Index [EW] Daleen Bo, MD     Patient Vitals for the past 24 hrs:  BP Temp Temp src Pulse Resp SpO2 Height Weight  01/09/18 2200 116/78 - - 84 20 99 % - -  01/09/18 2100 117/70 - - 84 (!) 22 96 % - -  01/09/18 2030 (!) 127/96 - - 91 (!) 22 98 % - -  01/09/18 2020 - 97.7 F (36.5 C) Rectal - - - - -  01/09/18 1840 - - - - - - 6\' 2"  (1.88 m) 74.8 kg  01/09/18 1839 96/67 98.2 F (36.8 C) Oral 89 (!) 24 94 % - -    10:25 PM Reevaluation with update and discussion. After initial assessment and treatment, an updated evaluation reveals he is alert and comfortable.  Oxygen saturation normal on nasal cannula oxygen.  Discussed with patient, and daughter, all questions answered. Daleen Bo   Medical Decision Making: Anorexia malaise, with bilateral pneumonia.  Mild hypotension on arrival improved with IV fluids.  Patient with persistent tachypnea improved somewhat with IV fluids.  Elevated white count present.  Renal insufficiency stable chronic.  Rash on  buttocks likely causing blood seen in underwear and in stool.  Doubt significant GI bleed.  CRITICAL CARE-yes Performed by: Daleen Bo  Nursing Notes Reviewed/ Care Coordinated Applicable Imaging Reviewed Interpretation of Laboratory Data incorporated into ED treatment   10:29 PM-Consult complete with hospitalist. Patient case explained and discussed.  He agrees to admit patient for further evaluation and treatment. Call ended at 10:32 PM  Plan: Admit  Final Clinical Impressions(s) / ED Diagnoses   Final diagnoses:  Community acquired pneumonia, unspecified laterality  Tobacco abuse  Rash    ED Discharge Orders    None       Daleen Bo, MD 01/09/18 2233

## 2018-01-10 ENCOUNTER — Other Ambulatory Visit: Payer: Self-pay

## 2018-01-10 ENCOUNTER — Encounter (HOSPITAL_COMMUNITY): Payer: Self-pay

## 2018-01-10 DIAGNOSIS — N184 Chronic kidney disease, stage 4 (severe): Secondary | ICD-10-CM

## 2018-01-10 DIAGNOSIS — E1142 Type 2 diabetes mellitus with diabetic polyneuropathy: Secondary | ICD-10-CM

## 2018-01-10 DIAGNOSIS — I480 Paroxysmal atrial fibrillation: Secondary | ICD-10-CM

## 2018-01-10 DIAGNOSIS — I10 Essential (primary) hypertension: Secondary | ICD-10-CM

## 2018-01-10 DIAGNOSIS — L8990 Pressure ulcer of unspecified site, unspecified stage: Secondary | ICD-10-CM

## 2018-01-10 LAB — GLUCOSE, CAPILLARY
Glucose-Capillary: 143 mg/dL — ABNORMAL HIGH (ref 70–99)
Glucose-Capillary: 184 mg/dL — ABNORMAL HIGH (ref 70–99)
Glucose-Capillary: 281 mg/dL — ABNORMAL HIGH (ref 70–99)
Glucose-Capillary: 219 mg/dL — ABNORMAL HIGH (ref 70–99)

## 2018-01-10 LAB — CBC WITH DIFFERENTIAL/PLATELET
Abs Immature Granulocytes: 0.1 K/uL — ABNORMAL HIGH (ref 0.00–0.07)
Basophils Absolute: 0 K/uL (ref 0.0–0.1)
Basophils Relative: 0 %
Eosinophils Absolute: 0.1 K/uL (ref 0.0–0.5)
Eosinophils Relative: 1 %
HCT: 32.5 % — ABNORMAL LOW (ref 39.0–52.0)
Hemoglobin: 10.3 g/dL — ABNORMAL LOW (ref 13.0–17.0)
Immature Granulocytes: 1 %
Lymphocytes Relative: 17 %
Lymphs Abs: 1.8 K/uL (ref 0.7–4.0)
MCH: 30.9 pg (ref 26.0–34.0)
MCHC: 31.7 g/dL (ref 30.0–36.0)
MCV: 97.6 fL (ref 80.0–100.0)
Monocytes Absolute: 0.6 K/uL (ref 0.1–1.0)
Monocytes Relative: 6 %
Neutro Abs: 7.6 K/uL (ref 1.7–7.7)
Neutrophils Relative %: 75 %
Platelets: 206 K/uL (ref 150–400)
RBC: 3.33 MIL/uL — ABNORMAL LOW (ref 4.22–5.81)
RDW: 14.3 % (ref 11.5–15.5)
WBC: 10.2 K/uL (ref 4.0–10.5)
nRBC: 0 % (ref 0.0–0.2)

## 2018-01-10 LAB — STREP PNEUMONIAE URINARY ANTIGEN: Strep Pneumo Urinary Antigen: POSITIVE — AB

## 2018-01-10 LAB — BASIC METABOLIC PANEL
Anion gap: 9 (ref 5–15)
BUN: 28 mg/dL — ABNORMAL HIGH (ref 8–23)
CO2: 23 mmol/L (ref 22–32)
Calcium: 9.5 mg/dL (ref 8.9–10.3)
Chloride: 105 mmol/L (ref 98–111)
Creatinine, Ser: 1.81 mg/dL — ABNORMAL HIGH (ref 0.61–1.24)
GFR calc Af Amer: 40 mL/min — ABNORMAL LOW (ref >60)
GFR, EST NON AFRICAN AMERICAN: 34 mL/min — AB (ref >60)
Glucose, Bld: 86 mg/dL (ref 70–99)
Potassium: 3.6 mmol/L (ref 3.5–5.1)
Sodium: 137 mmol/L (ref 135–145)

## 2018-01-10 LAB — MRSA PCR SCREENING: MRSA by PCR: NEGATIVE

## 2018-01-10 MED ORDER — ADULT MULTIVITAMIN W/MINERALS CH
1.0000 | ORAL_TABLET | Freq: Every day | ORAL | Status: DC
  Administered 2018-01-10 – 2018-01-11 (×2): 1 via ORAL
  Filled 2018-01-10 (×2): qty 1

## 2018-01-10 MED ORDER — ORAL CARE MOUTH RINSE
15.0000 mL | Freq: Two times a day (BID) | OROMUCOSAL | Status: DC
  Administered 2018-01-10 – 2018-01-11 (×2): 15 mL via OROMUCOSAL

## 2018-01-10 MED ORDER — ENOXAPARIN SODIUM 40 MG/0.4ML ~~LOC~~ SOLN
40.0000 mg | SUBCUTANEOUS | Status: DC
  Administered 2018-01-10: 40 mg via SUBCUTANEOUS
  Filled 2018-01-10: qty 0.4

## 2018-01-10 MED ORDER — FUROSEMIDE 40 MG PO TABS
40.0000 mg | ORAL_TABLET | Freq: Two times a day (BID) | ORAL | Status: DC
  Administered 2018-01-10 – 2018-01-11 (×2): 40 mg via ORAL
  Filled 2018-01-10 (×2): qty 1

## 2018-01-10 MED ORDER — ASPIRIN EC 81 MG PO TBEC
81.0000 mg | DELAYED_RELEASE_TABLET | Freq: Every day | ORAL | Status: DC
  Administered 2018-01-10 – 2018-01-11 (×2): 81 mg via ORAL
  Filled 2018-01-10 (×2): qty 1

## 2018-01-10 MED ORDER — ENSURE ENLIVE PO LIQD
237.0000 mL | Freq: Two times a day (BID) | ORAL | Status: DC
  Administered 2018-01-10 – 2018-01-11 (×4): 237 mL via ORAL

## 2018-01-10 MED ORDER — HYDROCODONE-ACETAMINOPHEN 5-325 MG PO TABS
1.0000 | ORAL_TABLET | Freq: Four times a day (QID) | ORAL | Status: DC | PRN
  Administered 2018-01-10 (×2): 1 via ORAL
  Filled 2018-01-10 (×2): qty 1

## 2018-01-10 MED ORDER — GABAPENTIN 400 MG PO CAPS
1600.0000 mg | ORAL_CAPSULE | Freq: Every day | ORAL | Status: DC
  Administered 2018-01-10 (×2): 1600 mg via ORAL
  Filled 2018-01-10 (×2): qty 4

## 2018-01-10 MED ORDER — PREDNISONE 10 MG PO TABS
10.0000 mg | ORAL_TABLET | Freq: Every day | ORAL | Status: DC
  Administered 2018-01-10 – 2018-01-11 (×2): 10 mg via ORAL
  Filled 2018-01-10 (×2): qty 1

## 2018-01-10 MED ORDER — LEVOTHYROXINE SODIUM 75 MCG PO TABS
75.0000 ug | ORAL_TABLET | ORAL | Status: DC
  Administered 2018-01-10 – 2018-01-11 (×2): 75 ug via ORAL
  Filled 2018-01-10 (×2): qty 1

## 2018-01-10 MED ORDER — HYDROCODONE-ACETAMINOPHEN 5-325 MG PO TABS: 1.0000 | ORAL_TABLET | Freq: Two times a day (BID) | ORAL | Status: DC

## 2018-01-10 MED ORDER — AMIODARONE HCL 200 MG PO TABS
100.0000 mg | ORAL_TABLET | Freq: Every day | ORAL | Status: DC
  Administered 2018-01-10 – 2018-01-11 (×2): 100 mg via ORAL
  Filled 2018-01-10 (×2): qty 1

## 2018-01-10 MED ORDER — SODIUM CHLORIDE 0.9 % IV SOLN
500.0000 mg | INTRAVENOUS | Status: DC
  Administered 2018-01-10: 500 mg via INTRAVENOUS
  Filled 2018-01-10 (×2): qty 500

## 2018-01-10 MED ORDER — BUDESONIDE 0.5 MG/2ML IN SUSP
0.5000 mg | Freq: Two times a day (BID) | RESPIRATORY_TRACT | Status: DC
  Administered 2018-01-10 – 2018-01-11 (×3): 0.5 mg via RESPIRATORY_TRACT
  Filled 2018-01-10 (×4): qty 2

## 2018-01-10 MED ORDER — PRO-STAT SUGAR FREE PO LIQD
30.0000 mL | Freq: Two times a day (BID) | ORAL | Status: DC
  Administered 2018-01-10 – 2018-01-11 (×3): 30 mL via ORAL
  Filled 2018-01-10 (×3): qty 30

## 2018-01-10 MED ORDER — GLYBURIDE 5 MG PO TABS
10.0000 mg | ORAL_TABLET | Freq: Two times a day (BID) | ORAL | Status: DC
  Administered 2018-01-10 – 2018-01-11 (×4): 10 mg via ORAL
  Filled 2018-01-10 (×4): qty 2

## 2018-01-10 MED ORDER — VITAMIN D 25 MCG (1000 UNIT) PO TABS
1000.0000 [IU] | ORAL_TABLET | Freq: Every day | ORAL | Status: DC
  Administered 2018-01-10 – 2018-01-11 (×2): 1000 [IU] via ORAL
  Filled 2018-01-10 (×2): qty 1

## 2018-01-10 MED ORDER — GABAPENTIN 400 MG PO CAPS
800.0000 mg | ORAL_CAPSULE | Freq: Every day | ORAL | Status: DC
  Administered 2018-01-10 – 2018-01-11 (×2): 800 mg via ORAL
  Filled 2018-01-10 (×2): qty 2

## 2018-01-10 MED ORDER — INSULIN ASPART 100 UNIT/ML ~~LOC~~ SOLN
0.0000 [IU] | Freq: Three times a day (TID) | SUBCUTANEOUS | Status: DC
  Administered 2018-01-10: 1 [IU] via SUBCUTANEOUS
  Administered 2018-01-10 (×2): 2 [IU] via SUBCUTANEOUS
  Administered 2018-01-11 (×2): 1 [IU] via SUBCUTANEOUS

## 2018-01-10 NOTE — Progress Notes (Signed)
Pt was ambulated around the unit on room air. Pt tolerated it well. The lowest his o2 sat got was 87% and the highest was 96%. But patient stayed in the 90's for most of the activity. Dr Roderic Palau made aware and suggested pt stay another night.   Jeris Penta, RN

## 2018-01-10 NOTE — Progress Notes (Signed)
Initial Nutrition Assessment  DOCUMENTATION CODES:  Not applicable  INTERVENTION:  Continue Ensure BID  Will order 30 mL Prostat BID, each supplement provides 100 kcal and 15 grams of protein.  MVI with minerals given wounds and poor diet quality at baseline.   NUTRITION DIAGNOSIS:  Increased nutrient needs related to wound healing as evidenced by estimated nutrition requirements for this outcome   GOAL:  Patient will meet greater than or equal to 90% of their needs  MONITOR:  PO intake, Supplement acceptance, Labs, I & O's, Skin  REASON FOR ASSESSMENT:  Malnutrition Screening Tool    ASSESSMENT:  84 y/o male PMHx DM2, HTN, CKD4, HF, Afib. Brought to ED d/t decreased appetite, poor oral intake, fatigue, chills and weakness x 1 week. CXR showed opacity suspicious for PNA. Admitted for tx of CAP.   When asked how he has been eating, pt says "half-ass". He says he hasnt had much of an appetite. He denies any n/v/d. He endorses constipation and says he hasnt gone to the bathroom in "a week" (RN documentation reports pt had BM today). He is under the impression that this is because he hasnt been eating.   At baseline, he says he takes Vit D and an MVI. He says he drinks the 1 equate/day. He does not follow any type of therapeutic diet and says he typically just eats "sandwiches and junk food".   Weight wise, he reports a UBW of 165 lbs. He was admitted at 160 lbs. The last objective weight measurement in chart prior to this encounter was from Oct 2018, at which time he was 184 lbs. He has lost ~20 lbs x 15 months.   At this time, pt reports he cannot eat any of the hospital food d/t not liking it. His daughter, who enters later, says pt is a picky eater. Daughter brought him food from a local fast food store.  Labs: BG: 184, 143, 86, BUN/Creat: 28/1.81, Albumin: 2.8, WBC: 13.4 -> 10.2 Meds: Ensure (drinking 100%), D3, Lasix, glyburide, prednisone, insulin.   Recent Labs  Lab  01/09/18 1918 01/10/18 0404  NA 136 137  K 4.8 3.6  CL 99 105  CO2 27 23  BUN 37* 28*  CREATININE 2.29* 1.81*  CALCIUM 10.6* 9.5  GLUCOSE 194* 86   NUTRITION - FOCUSED PHYSICAL EXAM: Deferred  Diet Order:   Diet Order            Diet heart healthy/carb modified Room service appropriate? Yes; Fluid consistency: Thin  Diet effective now             EDUCATION NEEDS:  Not appropriate for education at this time  Skin:   PU stage II to L heel,  2x Unstageable wounds to R thumb & R index finger 1x unstageable wound to R buttocks  Last BM:  1/7  Height:  Ht Readings from Last 1 Encounters:  01/10/18 6\' 2"  (1.88 m)   Weight:  Wt Readings from Last 1 Encounters:  01/10/18 72.7 kg   Wt Readings from Last 10 Encounters:  01/10/18 72.7 kg  10/04/16 83.6 kg  02/23/16 90 kg  10/20/15 88.5 kg  08/27/15 86.2 kg  08/08/15 87.5 kg  07/22/15 83.3 kg  07/03/15 85.7 kg  06/30/15 88.9 kg  06/23/15 89.8 kg   Ideal Body Weight:  86.36 kg  BMI:  Body mass index is 20.58 kg/m.  Estimated Nutritional Needs:  Kcal:  2050-2250 (28-31 kcal/kg bw) Protein:  95-110g Pro (1.3-1.5g/kg bw) Fluid:  2-2.2 L fluid (1 ml/kcal)  Burtis Junes RD, LDN, CNSC Clinical Nutrition Available Tues-Sat via Pager: [phone] 01/10/2018 3:22 PM

## 2018-01-10 NOTE — Progress Notes (Signed)
PROGRESS NOTE    Dylan Williamson  PRF:[id number] DOB: 08-Apr-1934 DOA: 01/09/2018 PCP: Rory Percy, MD    Brief Narrative:  84 year old male with a history of diabetes, hypertension, chronic kidney disease stage IV, paroxysmal atrial fibrillation, presents to the hospital with decreased appetite and generalized weakness.  Found to have evidence of pneumonia.  Started on ceftriaxone and azithromycin.  He was also mildly dehydrated.  Improving with IV fluids and intravenous antibiotics.  Patient is still desaturating while ambulating on room air.  Continue current treatments.   Assessment & Plan:   Principal Problem:   CAP (community acquired pneumonia) Active Problems:   Normocytic anemia   Hypertension   Atrial fibrillation (HCC)   DM type 2 (diabetes mellitus, type 2) (HCC)   CKD (chronic kidney disease) stage 4, GFR 15-29 ml/min (HCC)   Chronic diastolic heart failure (HCC)   Rectal bleeding   Pressure injury of skin   1. Acute respiratory failure with hypoxia.  Related to community acquired pneumonia.  Continue to wean off oxygen as tolerated. 2. Community-acquired pneumonia.  Currently on ceftriaxone and azithromycin.  No further fevers.  Continue to monitor. 3. Diabetes.  On glipizide and sliding scale insulin.  Blood sugars are stable. 4. Chronic diastolic heart failure.  Currently euvolemic.  Continue home dose of Lasix. 5. Chronic kidney disease stage IV.  Creatinine is near baseline.  Continue to monitor. 6. Atrial fibrillation.  Currently on amiodarone.  Heart rate stable.  Not on anticoagulation due to risk of bleeding and falls. 7. Hypertension.  Blood pressures currently stable.  Continue to monitor. 8. Pressure injury of the skin, stage II sacral area, present on admission 9. Diabetic neuropathy.  Chronically on prednisone.   DVT prophylaxis: Lovenox Code Status: Full code Family Communication: Discussed with daughter [name] over the phone Disposition Plan:  Discharge home once oxygen saturations are stable on ambulation   Consultants:     Procedures:     Antimicrobials:   Ceftriaxone 1/6 >  Azithromycin 1/6 >   Subjective: He is feeling better.  Still requiring supplemental oxygen.  Desaturates on ambulation.  Has a mild cough  Objective: Vitals:   01/10/18 1129 01/10/18 1200 01/10/18 1300 01/10/18 1608  BP:   109/60   Pulse:  63 79   Resp:  (!) 21 (!) 22   Temp: 98.3 F (36.8 C)   98 F (36.7 C)  TempSrc: Oral   Oral  SpO2:  90% 90%   Weight:      Height:        Intake/Output Summary (Last 24 hours) at 01/10/2018 1855 Last data filed at 01/10/2018 1700 Gross per 24 hour  Intake 1199.87 ml  Output 250 ml  Net 949.87 ml   Filed Weights   01/09/18 1840 01/10/18 0047 01/10/18 0500  Weight: 74.8 kg 72.7 kg 72.7 kg    Examination:  General exam: Appears calm and comfortable  Respiratory system: Occasional rhonchi. Respiratory effort normal. Cardiovascular system: S1 & S2 heard, RRR. No JVD, murmurs, rubs, gallops or clicks. No pedal edema. Gastrointestinal system: Abdomen is nondistended, soft and nontender. No organomegaly or masses felt. Normal bowel sounds heard. Central nervous system: Alert and oriented. No focal neurological deficits. Extremities: Symmetric 5 x 5 power. Skin: Bruising noted over right mid back Psychiatry: Judgement and insight appear normal. Mood & affect appropriate.     Data Reviewed: I have personally reviewed following labs and imaging studies  CBC: Recent Labs  Lab 01/09/18 1918 01/10/18 0404  WBC 13.4* 10.2  NEUTROABS 10.6* 7.6  HGB 12.2* 10.3*  HCT 38.0* 32.5*  MCV 95.7 97.6  PLT 269 740   Basic Metabolic Panel: Recent Labs  Lab 01/09/18 1918 01/10/18 0404  NA 136 137  K 4.8 3.6  CL 99 105  CO2 27 23  GLUCOSE 194* 86  BUN 37* 28*  CREATININE 2.29* 1.81*  CALCIUM 10.6* 9.5   GFR: Estimated Creatinine Clearance: 32.9 mL/min (A) (by C-G formula based on SCr of  1.81 mg/dL (H)). Liver Function Tests: Recent Labs  Lab 01/09/18 1918  AST 19  ALT 12  ALKPHOS 75  BILITOT 0.8  PROT 6.6  ALBUMIN 2.8*   No results for input(s): LIPASE, AMYLASE in the last 168 hours. No results for input(s): AMMONIA in the last 168 hours. Coagulation Profile: No results for input(s): INR, PROTIME in the last 168 hours. Cardiac Enzymes: No results for input(s): CKTOTAL, CKMB, CKMBINDEX, TROPONINI in the last 168 hours. BNP (last 3 results) No results for input(s): PROBNP in the last 8760 hours. HbA1C: No results for input(s): HGBA1C in the last 72 hours. CBG: Recent Labs  Lab 01/09/18 2011 01/10/18 0803 01/10/18 1125 01/10/18 1609  GLUCAP 169* 143* 184* 281*   Lipid Profile: No results for input(s): CHOL, HDL, LDLCALC, TRIG, CHOLHDL, LDLDIRECT in the last 72 hours. Thyroid Function Tests: No results for input(s): TSH, T4TOTAL, FREET4, T3FREE, THYROIDAB in the last 72 hours. Anemia Panel: No results for input(s): VITAMINB12, FOLATE, FERRITIN, TIBC, IRON, RETICCTPCT in the last 72 hours. Sepsis Labs: Recent Labs  Lab 01/09/18 2308  LATICACIDVEN 1.02    Recent Results (from the past 240 hour(s))  Blood Culture (routine x 2)     Status: None (Preliminary result)   Collection Time: 01/09/18  8:28 PM  Result Value Ref Range Status   Specimen Description LEFT ANTECUBITAL  Final   Special Requests   Final    BOTTLES DRAWN AEROBIC AND ANAEROBIC Blood Culture adequate volume   Culture   Final    NO GROWTH < 12 HOURS Performed at [facility], [address], [place], [place] [address]    Report Status PENDING  Incomplete  Blood Culture (routine x 2)     Status: None (Preliminary result)   Collection Time: 01/09/18  8:56 PM  Result Value Ref Range Status   Specimen Description RIGHT ANTECUBITAL  Final   Special Requests   Final    BOTTLES DRAWN AEROBIC AND ANAEROBIC Blood Culture adequate volume   Culture   Final    NO GROWTH < 12 HOURS Performed  at [facility], [address], [place], [place] [address]    Report Status PENDING  Incomplete  MRSA PCR Screening     Status: None   Collection Time: 01/10/18 12:32 AM  Result Value Ref Range Status   MRSA by PCR NEGATIVE NEGATIVE Final    Comment:        The GeneXpert MRSA Assay (FDA approved for NASAL specimens only), is one component of a comprehensive MRSA colonization surveillance program. It is not intended to diagnose MRSA infection nor to guide or monitor treatment for MRSA infections. Performed at [facility], [address] [place], [place] [address]          Radiology Studies: Dg Chest 2 View  Result Date: 01/09/2018 CLINICAL DATA:  Fatigue loss of appetite EXAM: CHEST - 2 VIEW COMPARISON:  06/23/2015 FINDINGS: Coarse reticulonodular opacity in the right greater than left lower lung zones. No significant  pleural effusion. Normal heart size. No pneumothorax. IMPRESSION: Mild reticulonodular opacity in the right greater than left lower lungs, suspicious pneumonia, possible atypical pneumonia. Negative for consolidation or pleural effusion. Electronically Signed   By: Donavan Foil M.D.   On: 01/09/2018 19:06        Scheduled Meds: . amiodarone  100 mg Oral Daily  . aspirin EC  81 mg Oral Daily  . budesonide (PULMICORT) nebulizer solution  0.5 mg Nebulization BID  . cholecalciferol  1,000 Units Oral Daily  . feeding supplement (ENSURE ENLIVE)  237 mL Oral BID BM  . feeding supplement (PRO-STAT SUGAR FREE 64)  30 mL Oral BID  . furosemide  40 mg Oral BID  . gabapentin  1,600 mg Oral QHS  . gabapentin  800 mg Oral Daily  . glyBURIDE  10 mg Oral BID WC  . insulin aspart  0-9 Units Subcutaneous TID WC  . ipratropium  0.5 mg Nebulization Q6H  . levalbuterol  0.63 mg Nebulization Q6H  . levothyroxine  75 mcg Oral [id number]  . mouth rinse  15 mL Mouth Rinse BID  . multivitamin with minerals  1 tablet Oral Daily  . predniSONE  10 mg Oral Daily   Continuous  Infusions: . azithromycin    . cefTRIAXone (ROCEPHIN)  IV Stopped (01/09/18 2129)     LOS: 1 day    Time spent: 4mins    Kathie Dike, MD [facility] Pager [phone]  If 7PM-7AM, please contact night-coverage [email or web address] Password [facility] 01/10/2018, 6:55 PM

## 2018-01-11 DIAGNOSIS — J13 Pneumonia due to Streptococcus pneumoniae: Principal | ICD-10-CM

## 2018-01-11 DIAGNOSIS — J9601 Acute respiratory failure with hypoxia: Secondary | ICD-10-CM

## 2018-01-11 DIAGNOSIS — I5032 Chronic diastolic (congestive) heart failure: Secondary | ICD-10-CM

## 2018-01-11 LAB — BASIC METABOLIC PANEL
ANION GAP: 8 (ref 5–15)
BUN: 32 mg/dL — ABNORMAL HIGH (ref 8–23)
CHLORIDE: 105 mmol/L (ref 98–111)
CO2: 26 mmol/L (ref 22–32)
Calcium: 10.2 mg/dL (ref 8.9–10.3)
Creatinine, Ser: 1.66 mg/dL — ABNORMAL HIGH (ref 0.61–1.24)
GFR calc Af Amer: 44 mL/min — ABNORMAL LOW (ref >60)
GFR calc non Af Amer: 38 mL/min — ABNORMAL LOW (ref >60)
Glucose, Bld: 150 mg/dL — ABNORMAL HIGH (ref 70–99)
Potassium: 3.4 mmol/L — ABNORMAL LOW (ref 3.5–5.1)
Sodium: 139 mmol/L (ref 135–145)

## 2018-01-11 LAB — CBC WITH DIFFERENTIAL/PLATELET
ABS IMMATURE GRANULOCYTES: 0.08 10*3/uL — AB (ref 0.00–0.07)
Basophils Absolute: 0 10*3/uL (ref 0.0–0.1)
Basophils Relative: 0 %
Eosinophils Absolute: 0 10*3/uL (ref 0.0–0.5)
Eosinophils Relative: 0 %
HCT: 29.9 % — ABNORMAL LOW (ref 39.0–52.0)
Hemoglobin: 9.4 g/dL — ABNORMAL LOW (ref 13.0–17.0)
Immature Granulocytes: 1 %
LYMPHS ABS: 1.2 10*3/uL (ref 0.7–4.0)
Lymphocytes Relative: 13 %
MCH: 30 pg (ref 26.0–34.0)
MCHC: 31.4 g/dL (ref 30.0–36.0)
MCV: 95.5 fL (ref 80.0–100.0)
Monocytes Absolute: 0.4 10*3/uL (ref 0.1–1.0)
Monocytes Relative: 4 %
Neutro Abs: 7.7 10*3/uL (ref 1.7–7.7)
Neutrophils Relative %: 82 %
Platelets: 201 10*3/uL (ref 150–400)
RBC: 3.13 MIL/uL — ABNORMAL LOW (ref 4.22–5.81)
RDW: 14.3 % (ref 11.5–15.5)
WBC: 9.4 10*3/uL (ref 4.0–10.5)
nRBC: 0 % (ref 0.0–0.2)

## 2018-01-11 LAB — EXPECTORATED SPUTUM ASSESSMENT W GRAM STAIN, RFLX TO RESP C

## 2018-01-11 LAB — GLUCOSE, CAPILLARY
Glucose-Capillary: 116 mg/dL — ABNORMAL HIGH (ref 70–99)
Glucose-Capillary: 139 mg/dL — ABNORMAL HIGH (ref 70–99)
Glucose-Capillary: 259 mg/dL — ABNORMAL HIGH (ref 70–99)

## 2018-01-11 MED ORDER — ALBUTEROL SULFATE (2.5 MG/3ML) 0.083% IN NEBU: 2.5000 mg | INHALATION_SOLUTION | RESPIRATORY_TRACT | 12 refills | Status: AC | PRN

## 2018-01-11 MED ORDER — GUAIFENESIN ER 600 MG PO TB12: 600.0000 mg | ORAL_TABLET | Freq: Two times a day (BID) | ORAL | 2 refills | Status: AC

## 2018-01-11 MED ORDER — AMOXICILLIN-POT CLAVULANATE 875-125 MG PO TABS: 1.0000 | ORAL_TABLET | Freq: Two times a day (BID) | ORAL | 0 refills | Status: AC

## 2018-01-11 MED ORDER — AZITHROMYCIN 250 MG PO TABS: ORAL_TABLET | ORAL | 0 refills | Status: AC

## 2018-01-11 NOTE — Care Management Note (Signed)
Case Management Note  Patient Details  Name: MYLON MABEY MRN: [id number] Date of Birth: [birth year]  Subjective/Objective:     CAP. From home. Has [facility] pta. Daughter, [name] requesting home health. Discussed options/services availabe and offered choice. Patient/daughter interested in home health PT, RN and aide.  PCP-Kevin Nadara Mustard Daughter drives patient to appointments.  Currently on room air in bed.           Action/Plan: DC home with home health. [facility] [place] notified and will obtain orders when available.   Expected Discharge Date:  01/11/18               Expected Discharge Plan:  [place]  In-House Referral:     Discharge planning Services  CM Consult  Post Acute Care Choice:    Choice offered to:  Adult Children, Patient  DME Arranged:    DME Agency:     HH Arranged:  PT, RN, Nurse's Aide [place] Agency:  [place]  Status of Service:  Completed, signed off  If discussed at [place] of Stay Meetings, dates discussed:    Additional Comments:  Quadir Muns, Chauncey Reading, RN 01/11/2018, 1:56 PM

## 2018-01-11 NOTE — Plan of Care (Signed)

## 2018-01-11 NOTE — Progress Notes (Signed)
SATURATION QUALIFICATIONS: (This note is used to comply with regulatory documentation for home oxygen)  Patient Saturations on Room Air at Rest = 85%  Patient Saturations on Room Air while Ambulating = 81%  Patient Saturations on 2 Liters of oxygen while Ambulating = 92%  Please briefly explain why patient needs home oxygen: to maintain oxygen saturation

## 2018-01-11 NOTE — Discharge Summary (Signed)
Physician Discharge Summary  Dylan Williamson WHQ:[id number] DOB: 05/20/1934 DOA: 01/09/2018  PCP: Rory Percy, MD  Admit date: 01/09/2018 Discharge date: 01/11/2018  Admitted From: home Disposition:  home  Recommendations for Outpatient Follow-up:  1. Follow up with PCP in 1-2 weeks 2. Please obtain BMP/CBC in one week 3. Repeat chest xray in 3-4 weeks  Home Health: home health, RN, PT, aide Equipment/Devices: nebs, oxygen at 2L  Discharge Condition:stable CODE STATUS:full code Diet recommendation: heart healthy, carb modified  Brief/Interim Summary: 84 year old male with a history of chronic kidney disease stage IV, paroxysmal atrial fibrillation, congestive heart failure, presents to the hospital decreased appetite and generalized weakness.  He was found to have community-acquired pneumonia.  He was admitted to the hospital and started on ceftriaxone and azithromycin.  Discharge Diagnoses:  Principal Problem:   CAP (community acquired pneumonia) Active Problems:   Normocytic anemia   Hypertension   Atrial fibrillation (HCC)   Acute respiratory failure with hypoxia (HCC)   DM type 2 (diabetes mellitus, type 2) (HCC)   CKD (chronic kidney disease) stage 4, GFR 15-29 ml/min (HCC)   Chronic diastolic heart failure (HCC)   Rectal bleeding   Pressure injury of skin   Pneumococcal pneumonia ([place])  1. Acute respiratory failure with hypoxia.  Related to community-acquired pneumonia.  Patient overall respiratory status has improved.  He is able to ambulate without any significant dyspnea.  He does still become somewhat hypoxic and will be discharged home on supplemental oxygen at 2 L. 2. Community-acquired pneumonia secondary to pneumococcus.  Urinary strep antigen positive.  He was treated with ceftriaxone and azithromycin.  He will be transitioned to oral antibiotics.  He is not having any further fevers. 3. Diabetes.  Resume glipizide on discharge.  Blood sugars have been  stable. 4. Chronic diastolic heart failure.  Currently euvolemic.  Continue on home dose of Lasix. 5. Chronic kidney disease stage IV.  Mild improvement of creatinine with IV fluids. 6. Atrial fibrillation.  Currently on amiodarone.  Heart rate is stable.  Not on anticoagulation due to risk of bleeding and falls. 7. Hypertension.  Blood pressure stable. 8. Pressure injury of the skin, stage II sacral area, present on admission. 9. Diabetic neuropathy.  Chronically on prednisone.  Discharge Instructions  Discharge Instructions    Diet - low sodium heart healthy   Complete by:  As directed    Increase activity slowly   Complete by:  As directed      Allergies as of 01/11/2018   No Known Allergies     Medication List    TAKE these medications   albuterol (2.5 MG/3ML) 0.083% nebulizer solution Commonly known as:  PROVENTIL Take 3 mLs (2.5 mg total) by nebulization every 4 (four) hours as needed for wheezing or shortness of breath.   amiodarone 200 MG tablet Commonly known as:  PACERONE TAKE 1/2 TABLET BY MOUTH DAILY.   amoxicillin-clavulanate 875-125 MG tablet Commonly known as:  AUGMENTIN Take 1 tablet by mouth 2 (two) times daily for 5 days.   aspirin EC 81 MG tablet Take 1 tablet (81 mg total) by mouth daily.   azithromycin 250 MG tablet Commonly known as:  ZITHROMAX Z-PAK Take 2 tablets (500 mg) on  Day 1,  followed by 1 tablet (250 mg) once daily on Days 2 through 5.   cholecalciferol 1000 units tablet Commonly known as:  VITAMIN D Take 1,000 Units by mouth daily.   furosemide 40 MG tablet Commonly known as:  LASIX  Take 40 mg by mouth 2 (two) times daily.   gabapentin 800 MG tablet Commonly known as:  NEURONTIN Take 800-1,600 mg by mouth See admin instructions. 800mg  in the morning and 1600mg  in the evneing   glyBURIDE 5 MG tablet Commonly known as:  DIABETA Take 10 mg by mouth 2 (two) times daily with a meal.   guaiFENesin 600 MG 12 hr tablet Commonly known  as:  MUCINEX Take 1 tablet (600 mg total) by mouth 2 (two) times daily.   HYDROcodone-acetaminophen 5-325 MG tablet Commonly known as:  NORCO/VICODIN Take 1 tablet by mouth 2 (two) times daily.   levothyroxine 75 MCG tablet Commonly known as:  SYNTHROID, LEVOTHROID Take 75 mcg by mouth every morning.   multivitamin with minerals tablet Take 1 tablet by mouth daily.   predniSONE 10 MG tablet Commonly known as:  DELTASONE Take 10 mg by mouth daily.            Durable Medical Equipment  (From admission, onward)         Start     Ordered   01/11/18 1511  For home use only DME oxygen  Once    Question Answer Comment  Mode or (Route) Nasal cannula   Liters per Minute 2   Frequency Continuous (stationary and portable oxygen unit needed)   Oxygen conserving device Yes   Oxygen delivery system Gas      01/11/18 1510   01/11/18 1405  For home use only DME Nebulizer machine  Once    Question:  Patient needs a nebulizer to treat with the following condition  Answer:  CAP (community acquired pneumonia)   01/11/18 1404         Follow-up [facility] Follow up.   Why:  RN, aide, PT oxygen and nebulizer machine Contact information: [address] [place] [address] [phone]         No Known Allergies  Consultations:     Procedures/Studies: Dg Chest 2 View  Result Date: 01/09/2018 CLINICAL DATA:  Fatigue loss of appetite EXAM: CHEST - 2 VIEW COMPARISON:  06/23/2015 FINDINGS: Coarse reticulonodular opacity in the right greater than left lower lung zones. No significant pleural effusion. Normal heart size. No pneumothorax. IMPRESSION: Mild reticulonodular opacity in the right greater than left lower lungs, suspicious pneumonia, possible atypical pneumonia. Negative for consolidation or pleural effusion. Electronically Signed   By: Donavan Foil M.D.   On: 01/09/2018 19:06      Subjective: Feeling better. Has some  productive cough. Shortness of breath is better  Discharge Exam: Vitals:   01/11/18 0808 01/11/18 0817 01/11/18 1421 01/11/18 1438  BP:    119/66  Pulse:    85  Resp:    19  Temp:    98 F (36.7 C)  TempSrc:    Oral  SpO2: 90% 95% (!) 85% 93%  Weight:      Height:        General: Pt is alert, awake, not in acute distress Cardiovascular: RRR, S1/S2 +, no rubs, no gallops Respiratory: bilateral rhonchi, no wheezing Abdominal: Soft, NT, ND, bowel sounds + Extremities: no edema, no cyanosis    The results of significant diagnostics from this hospitalization (including imaging, microbiology, ancillary and laboratory) are listed below for reference.     Microbiology: Recent Results (from the past 240 hour(s))  Blood Culture (routine x 2)     Status: None (Preliminary result)   Collection Time:  01/09/18  8:28 PM  Result Value Ref Range Status   Specimen Description LEFT ANTECUBITAL  Final   Special Requests   Final    BOTTLES DRAWN AEROBIC AND ANAEROBIC Blood Culture adequate volume   Culture   Final    NO GROWTH 2 DAYS Performed at [facility], [address], [place], [place] [address]    Report Status PENDING  Incomplete  Blood Culture (routine x 2)     Status: None (Preliminary result)   Collection Time: 01/09/18  8:56 PM  Result Value Ref Range Status   Specimen Description RIGHT ANTECUBITAL  Final   Special Requests   Final    BOTTLES DRAWN AEROBIC AND ANAEROBIC Blood Culture adequate volume   Culture   Final    NO GROWTH 2 DAYS Performed at [facility], [address], [place], [place] [address]    Report Status PENDING  Incomplete  MRSA PCR Screening     Status: None   Collection Time: 01/10/18 12:32 AM  Result Value Ref Range Status   MRSA by PCR NEGATIVE NEGATIVE Final    Comment:        The GeneXpert MRSA Assay (FDA approved for NASAL specimens only), is one component of a comprehensive MRSA colonization surveillance program. It is not intended to  diagnose MRSA infection nor to guide or monitor treatment for MRSA infections. Performed at [facility], [address], [place], [place] [address]   Culture, sputum-assessment     Status: None   Collection Time: 01/10/18  8:14 PM  Result Value Ref Range Status   Specimen Description EXPECTORATED SPUTUM  Final   Special Requests Immunocompromised  Final   Sputum evaluation   Final    THIS SPECIMEN IS ACCEPTABLE FOR SPUTUM CULTURE Performed at [facility], [address] [place] [address], [place], [place] [address]    Report Status 01/11/2018 FINAL  Final  Culture, respiratory     Status: None (Preliminary result)   Collection Time: 01/10/18  8:14 PM  Result Value Ref Range Status   Specimen Description   Final    EXPECTORATED SPUTUM Performed at [facility], [address] [place], [place] [address]    Special Requests   Final    Immunocompromised Reflexed from [phone] Performed at [facility], [address] [place], [place] [address]    Gram Stain   Final    FEW WBC PRESENT, PREDOMINANTLY PMN MODERATE BUDDING YEAST SEEN FEW GRAM POSITIVE RODS Performed at [facility], [place] [address], [place], [place] [address]    Culture PENDING  Incomplete   Report Status PENDING  Incomplete     Labs: BNP (last 3 results) No results for input(s): BNP in the last 8760 hours. Basic Metabolic Panel: Recent Labs  Lab 01/09/18 1918 01/10/18 0404 01/11/18 0641  NA 136 137 139  K 4.8 3.6 3.4*  CL 99 105 105  CO2 27 23 26   GLUCOSE 194* 86 150*  BUN 37* 28* 32*  CREATININE 2.29* 1.81* 1.66*  CALCIUM 10.6* 9.5 10.2   Liver Function Tests: Recent Labs  Lab 01/09/18 1918  AST 19  ALT 12  ALKPHOS 75  BILITOT 0.8  PROT 6.6  ALBUMIN 2.8*   No results for input(s): LIPASE, AMYLASE in the last 168 hours. No results for input(s): AMMONIA in the last 168 hours. CBC: Recent Labs  Lab 01/09/18 1918 01/10/18 0404 01/11/18 0641  WBC 13.4* 10.2 9.4  NEUTROABS 10.6* 7.6 7.7  HGB 12.2*  10.3* 9.4*  HCT 38.0* 32.5* 29.9*  MCV 95.7 97.6 95.5  PLT 269 206 201   Cardiac Enzymes: No results for input(s): CKTOTAL, CKMB, CKMBINDEX, TROPONINI in the last 168 hours. BNP: Invalid input(s): POCBNP CBG: Recent Labs  Lab 01/10/18 1609 01/10/18 2056 01/11/18 0803 01/11/18 1103 01/11/18 1551  GLUCAP 281* 219* 116* 139* 259*   D-Dimer No results for input(s): DDIMER in the last 72 hours. Hgb A1c No results for input(s): HGBA1C in the last 72 hours. Lipid Profile No results for input(s): CHOL, HDL, LDLCALC, TRIG, CHOLHDL, LDLDIRECT in the last 72 hours. Thyroid function studies No results for input(s): TSH, T4TOTAL, T3FREE, THYROIDAB in the last 72 hours.  Invalid input(s): FREET3 Anemia work up No results for input(s): VITAMINB12, FOLATE, FERRITIN, TIBC, IRON, RETICCTPCT in the last 72 hours. Urinalysis    Component Value Date/Time   COLORURINE YELLOW 01/09/2018 2257   APPEARANCEUR CLEAR 01/09/2018 2257   LABSPEC 1.010 01/09/2018 2257   PHURINE 7.0 01/09/2018 2257   GLUCOSEU NEGATIVE 01/09/2018 2257   HGBUR NEGATIVE 01/09/2018 2257   BILIRUBINUR NEGATIVE 01/09/2018 2257   KETONESUR NEGATIVE 01/09/2018 2257   PROTEINUR NEGATIVE 01/09/2018 2257   NITRITE NEGATIVE 01/09/2018 2257   LEUKOCYTESUR NEGATIVE 01/09/2018 2257   Sepsis Labs Invalid input(s): PROCALCITONIN,  WBC,  LACTICIDVEN Microbiology Recent Results (from the past 240 hour(s))  Blood Culture (routine x 2)     Status: None (Preliminary result)   Collection Time: 01/09/18  8:28 PM  Result Value Ref Range Status   Specimen Description LEFT ANTECUBITAL  Final   Special Requests   Final    BOTTLES DRAWN AEROBIC AND ANAEROBIC Blood Culture adequate volume   Culture   Final    NO GROWTH 2 DAYS Performed at [facility], [address] [place], [place] [address]    Report Status PENDING  Incomplete  Blood Culture (routine x 2)     Status: None (Preliminary result)   Collection Time: 01/09/18  8:56 PM   Result Value Ref Range Status   Specimen Description RIGHT ANTECUBITAL  Final   Special Requests   Final    BOTTLES DRAWN AEROBIC AND ANAEROBIC Blood Culture adequate volume   Culture   Final    NO GROWTH 2 DAYS Performed at [facility], [address], [place], [place] [address]    Report Status PENDING  Incomplete  MRSA PCR Screening     Status: None   Collection Time: 01/10/18 12:32 AM  Result Value Ref Range Status   MRSA by PCR NEGATIVE NEGATIVE Final    Comment:        The GeneXpert MRSA Assay (FDA approved for NASAL specimens only), is one component of a comprehensive MRSA colonization surveillance program. It is not intended to diagnose MRSA infection nor to guide or monitor treatment for MRSA infections. Performed at [facility], [address], [place], [place] [address]   Culture, sputum-assessment     Status: None   Collection Time: 01/10/18  8:14 PM  Result Value Ref Range Status   Specimen Description EXPECTORATED SPUTUM  Final   Special Requests Immunocompromised  Final   Sputum evaluation   Final    THIS SPECIMEN IS ACCEPTABLE FOR SPUTUM CULTURE Performed at [facility], [address], [place], [place] [address]    Report Status 01/11/2018 FINAL  Final  Culture, respiratory     Status: None (Preliminary result)   Collection Time: 01/10/18  8:14 PM  Result Value Ref Range Status   Specimen Description  Final    EXPECTORATED SPUTUM Performed at [facility], [address] [place], [place] [address]    Special Requests   Final    Immunocompromised Reflexed from [phone] Performed at [facility], [address], [place], [place] [address]    Gram Stain   Final    FEW WBC PRESENT, PREDOMINANTLY PMN MODERATE BUDDING YEAST SEEN FEW GRAM POSITIVE RODS Performed at [facility], [place] [address], [place], [place] [address]    Culture PENDING  Incomplete   Report Status PENDING  Incomplete     Time coordinating discharge:  47mins  SIGNED:   Kathie Dike, MD  [facility] 01/11/2018, 7:04 PM Pager   If 7PM-7AM, please contact night-coverage [email or web address] Password [email or web address]

## 2018-01-11 NOTE — Plan of Care (Signed)
Problem: Education: Goal: Knowledge of General Education information will improve Description Including pain rating scale, medication(s)/side effects and non-pharmacologic comfort measures 01/11/2018 1530 by Rance Muir, RN Outcome: Adequate for Discharge 01/11/2018 1100 by Rance Muir, RN Outcome: Progressing 01/11/2018 1058 by Rance Muir, RN Outcome: Progressing   Problem: Health Behavior/Discharge Planning: Goal: Ability to manage health-related needs will improve 01/11/2018 1530 by Rance Muir, RN Outcome: Adequate for Discharge 01/11/2018 1100 by Rance Muir, RN Outcome: Progressing 01/11/2018 1058 by Rance Muir, RN Outcome: Progressing   Problem: Clinical Measurements: Goal: Ability to maintain clinical measurements within normal limits will improve 01/11/2018 1530 by Rance Muir, RN Outcome: Adequate for Discharge 01/11/2018 1100 by Rance Muir, RN Outcome: Progressing 01/11/2018 1058 by Rance Muir, RN Outcome: Progressing Goal: Will remain free from infection 01/11/2018 1530 by Rance Muir, RN Outcome: Adequate for Discharge 01/11/2018 1100 by Rance Muir, RN Outcome: Progressing 01/11/2018 1058 by Rance Muir, RN Outcome: Progressing Goal: Diagnostic test results will improve 01/11/2018 1530 by Rance Muir, RN Outcome: Adequate for Discharge 01/11/2018 1100 by Rance Muir, RN Outcome: Progressing 01/11/2018 1058 by Rance Muir, RN Outcome: Progressing Goal: Respiratory complications will improve 01/11/2018 1530 by Rance Muir, RN Outcome: Adequate for Discharge 01/11/2018 1100 by Rance Muir, RN Outcome: Progressing 01/11/2018 1058 by Rance Muir, RN Outcome: Progressing Goal: Cardiovascular complication will be avoided 01/11/2018 1530 by Rance Muir, RN Outcome: Adequate for Discharge 01/11/2018 1100 by Rance Muir, RN Outcome: Progressing 01/11/2018 1058 by Rance Muir, RN Outcome: Progressing   Problem: Activity: Goal: Risk for activity intolerance will decrease 01/11/2018 1530 by Rance Muir, RN Outcome: Adequate for  Discharge 01/11/2018 1100 by Rance Muir, RN Outcome: Progressing 01/11/2018 1058 by Rance Muir, RN Outcome: Progressing   Problem: Nutrition: Goal: Adequate nutrition will be maintained 01/11/2018 1530 by Rance Muir, RN Outcome: Adequate for Discharge 01/11/2018 1100 by Rance Muir, RN Outcome: Progressing 01/11/2018 1058 by Rance Muir, RN Outcome: Progressing   Problem: Coping: Goal: Level of anxiety will decrease 01/11/2018 1530 by Rance Muir, RN Outcome: Adequate for Discharge 01/11/2018 1100 by Rance Muir, RN Outcome: Progressing 01/11/2018 1058 by Rance Muir, RN Outcome: Progressing   Problem: Elimination: Goal: Will not experience complications related to bowel motility 01/11/2018 1530 by Rance Muir, RN Outcome: Adequate for Discharge 01/11/2018 1100 by Rance Muir, RN Outcome: Progressing 01/11/2018 1058 by Rance Muir, RN Outcome: Progressing Goal: Will not experience complications related to urinary retention 01/11/2018 1530 by Rance Muir, RN Outcome: Adequate for Discharge 01/11/2018 1100 by Rance Muir, RN Outcome: Progressing 01/11/2018 1058 by Rance Muir, RN Outcome: Progressing   Problem: Pain Managment: Goal: General experience of comfort will improve 01/11/2018 1530 by Rance Muir, RN Outcome: Adequate for Discharge 01/11/2018 1100 by Rance Muir, RN Outcome: Progressing 01/11/2018 1058 by Rance Muir, RN Outcome: Progressing   Problem: Safety: Goal: Ability to remain free from injury will improve 01/11/2018 1530 by Rance Muir, RN Outcome: Adequate for Discharge 01/11/2018 1100 by Rance Muir, RN Outcome: Progressing 01/11/2018 1058 by Rance Muir, RN Outcome: Progressing   Problem: Skin Integrity: Goal: Risk for impaired skin integrity will decrease 01/11/2018 1530 by Rance Muir, RN Outcome: Adequate for Discharge 01/11/2018 1100 by Rance Muir, RN Outcome: Progressing 01/11/2018 1058 by Rance Muir, RN Outcome: Progressing   Problem: Activity: Goal: Ability to tolerate increased activity will improve 01/11/2018  1530 by Rance Muir, RN Outcome: Adequate for Discharge 01/11/2018 1100 by Rance Muir, RN Outcome: Progressing 01/11/2018 1058 by Rance Muir, RN Outcome: Progressing   Problem: Clinical Measurements:  Goal: Ability to maintain a body temperature in the normal range will improve 01/11/2018 1530 by Rance Muir, RN Outcome: Adequate for Discharge 01/11/2018 1100 by Rance Muir, RN Outcome: Progressing 01/11/2018 1058 by Rance Muir, RN Outcome: Progressing   Problem: Respiratory: Goal: Ability to maintain adequate ventilation will improve 01/11/2018 1530 by Rance Muir, RN Outcome: Adequate for Discharge 01/11/2018 1100 by Rance Muir, RN Outcome: Progressing 01/11/2018 1058 by Rance Muir, RN Outcome: Progressing Goal: Ability to maintain a clear airway will improve 01/11/2018 1530 by Rance Muir, RN Outcome: Adequate for Discharge 01/11/2018 1100 by Rance Muir, RN Outcome: Progressing 01/11/2018 1058 by Rance Muir, RN Outcome: Progressing

## 2018-01-11 NOTE — Care Management Note (Signed)
Case Management Note  Patient Details  Name: Dylan Williamson MRN: [id number] Date of Birth: 07/10/1934  Subjective/Objective:                    Action/Plan: Patient will qualify for home oxygen. Discussed again with daughter. [facility] is choice.  Vaughan Basta of [facility] notified and will deliver portable tank to room and nebulizer machine also.   Expected Discharge Date:  01/11/18               Expected Discharge Plan:  [place]  In-House Referral:     Discharge planning Services  CM Consult  Post Acute Care Choice:    Choice offered to:  Adult Children, Patient  DME Arranged:   nebulizer machine and oxygen DME Agency:   [facility]  HH Arranged:  PT, RN, Nurse's Aide [place] Agency:  [place]  Status of Service:  Completed, signed off  If discussed at [place] of Stay Meetings, dates discussed:    Additional Comments:  Jiayi Lengacher, Chauncey Reading, RN 01/11/2018, 3:11 PM

## 2018-01-11 NOTE — Plan of Care (Signed)
  Problem: Education: Goal: Knowledge of General Education information will improve Description Including pain rating scale, medication(s)/side effects and non-pharmacologic comfort measures 01/11/2018 1100 by Rance Muir, RN Outcome: Progressing 01/11/2018 1058 by Rance Muir, RN Outcome: Progressing   Problem: Health Behavior/Discharge Planning: Goal: Ability to manage health-related needs will improve 01/11/2018 1100 by Rance Muir, RN Outcome: Progressing 01/11/2018 1058 by Rance Muir, RN Outcome: Progressing   Problem: Clinical Measurements: Goal: Ability to maintain clinical measurements within normal limits will improve 01/11/2018 1100 by Rance Muir, RN Outcome: Progressing 01/11/2018 1058 by Rance Muir, RN Outcome: Progressing Goal: Will remain free from infection 01/11/2018 1100 by Rance Muir, RN Outcome: Progressing 01/11/2018 1058 by Rance Muir, RN Outcome: Progressing Goal: Diagnostic test results will improve 01/11/2018 1100 by Rance Muir, RN Outcome: Progressing 01/11/2018 1058 by Rance Muir, RN Outcome: Progressing Goal: Respiratory complications will improve 01/11/2018 1100 by Rance Muir, RN Outcome: Progressing 01/11/2018 1058 by Rance Muir, RN Outcome: Progressing Goal: Cardiovascular complication will be avoided 01/11/2018 1100 by Rance Muir, RN Outcome: Progressing 01/11/2018 1058 by Rance Muir, RN Outcome: Progressing   Problem: Activity: Goal: Risk for activity intolerance will decrease 01/11/2018 1100 by Rance Muir, RN Outcome: Progressing 01/11/2018 1058 by Rance Muir, RN Outcome: Progressing   Problem: Nutrition: Goal: Adequate nutrition will be maintained 01/11/2018 1100 by Rance Muir, RN Outcome: Progressing 01/11/2018 1058 by Rance Muir, RN Outcome: Progressing   Problem: Coping: Goal: Level of anxiety will decrease 01/11/2018 1100 by Rance Muir, RN Outcome: Progressing 01/11/2018 1058 by Rance Muir, RN Outcome: Progressing   Problem: Elimination: Goal: Will not experience complications  related to bowel motility 01/11/2018 1100 by Rance Muir, RN Outcome: Progressing 01/11/2018 1058 by Rance Muir, RN Outcome: Progressing Goal: Will not experience complications related to urinary retention 01/11/2018 1100 by Rance Muir, RN Outcome: Progressing 01/11/2018 1058 by Rance Muir, RN Outcome: Progressing   Problem: Pain Managment: Goal: General experience of comfort will improve 01/11/2018 1100 by Rance Muir, RN Outcome: Progressing 01/11/2018 1058 by Rance Muir, RN Outcome: Progressing   Problem: Safety: Goal: Ability to remain free from injury will improve 01/11/2018 1100 by Rance Muir, RN Outcome: Progressing 01/11/2018 1058 by Rance Muir, RN Outcome: Progressing   Problem: Skin Integrity: Goal: Risk for impaired skin integrity will decrease 01/11/2018 1100 by Rance Muir, RN Outcome: Progressing 01/11/2018 1058 by Rance Muir, RN Outcome: Progressing   Problem: Activity: Goal: Ability to tolerate increased activity will improve 01/11/2018 1100 by Rance Muir, RN Outcome: Progressing 01/11/2018 1058 by Rance Muir, RN Outcome: Progressing   Problem: Clinical Measurements: Goal: Ability to maintain a body temperature in the normal range will improve 01/11/2018 1100 by Rance Muir, RN Outcome: Progressing 01/11/2018 1058 by Rance Muir, RN Outcome: Progressing   Problem: Respiratory: Goal: Ability to maintain adequate ventilation will improve 01/11/2018 1100 by Rance Muir, RN Outcome: Progressing 01/11/2018 1058 by Rance Muir, RN Outcome: Progressing Goal: Ability to maintain a clear airway will improve 01/11/2018 1100 by Rance Muir, RN Outcome: Progressing 01/11/2018 1058 by Rance Muir, RN Outcome: Progressing

## 2018-01-11 NOTE — Discharge Instructions (Signed)
Community-Acquired Pneumonia, Adult  Pneumonia is an infection of the lungs. It causes swelling in the airways of the lungs. Mucus and fluid may also build up inside the airways.  One type of pneumonia can happen while a person is in a hospital. A different type can happen when a person is not in a hospital (community-acquired pneumonia).   What are the causes?    This condition is caused by germs (viruses, bacteria, or fungi). Some types of germs can be passed from one person to another. This can happen when you breathe in droplets from the cough or sneeze of an infected person.  What increases the risk?  You are more likely to develop this condition if you:   Have a long-term (chronic) disease, such as:  ? Chronic obstructive pulmonary disease (COPD).  ? Asthma.  ? Cystic fibrosis.  ? Congestive heart failure.  ? Diabetes.  ? Kidney disease.   Have HIV.   Have sickle cell disease.   Have had your spleen removed.   Do not take good care of your teeth and mouth (poor dental hygiene).   Have a medical condition that increases the risk of breathing in droplets from your own mouth and nose.   Have a weakened body defense system (immune system).   Are a smoker.   Travel to areas where the germs that cause this illness are common.   Are around certain animals or the places they live.  What are the signs or symptoms?   A dry cough.   A wet (productive) cough.   Fever.   Sweating.   Chest pain. This often happens when breathing deeply or coughing.   Fast breathing or trouble breathing.   Shortness of breath.   Shaking chills.   Feeling tired (fatigue).   Muscle aches.  How is this treated?  Treatment for this condition depends on many things. Most adults can be treated at home. In some cases, treatment must happen in a hospital. Treatment may include:   Medicines given by mouth or through an IV tube.   Being given extra oxygen.   Respiratory therapy.  In rare cases, treatment for very bad pneumonia  may include:   Using a machine to help you breathe.   Having a procedure to remove fluid from around your lungs.  Follow these instructions at home:  Medicines   Take over-the-counter and prescription medicines only as told by your doctor.  ? Only take cough medicine if you are losing sleep.   If you were prescribed an antibiotic medicine, take it as told by your doctor. Do not stop taking the antibiotic even if you start to feel better.  General instructions     Sleep with your head and neck raised (elevated). You can do this by sleeping in a recliner or by putting a few pillows under your head.   Rest as needed. Get at least 8 hours of sleep each night.   Drink enough water to keep your pee (urine) pale yellow.   Eat a healthy diet that includes plenty of vegetables, fruits, whole grains, low-fat dairy products, and lean protein.   Do not use any products that contain nicotine or tobacco. These include cigarettes, e-cigarettes, and chewing tobacco. If you need help quitting, ask your doctor.   Keep all follow-up visits as told by your doctor. This is important.  How is this prevented?  A shot (vaccine) can help prevent pneumonia. Shots are often suggested for:   People   older than 82 years of age.   People older than 82 years of age who:  ? Are having cancer treatment.  ? Have long-term (chronic) lung disease.  ? Have problems with their body's defense system.  You may also prevent pneumonia if you take these actions:   Get the flu (influenza) shot every year.   Go to the dentist as often as told.   Wash your hands often. If you cannot use soap and water, use hand sanitizer.  Contact a doctor if:   You have a fever.   You lose sleep because your cough medicine does not help.  Get help right away if:   You are short of breath and it gets worse.   You have more chest pain.   Your sickness gets worse. This is very serious if:  ? You are an older adult.  ? Your body's defense system is weak.   You  cough up blood.  Summary   Pneumonia is an infection of the lungs.   Most adults can be treated at home. Some will need treatment in a hospital.   Drink enough water to keep your pee pale yellow.   Get at least 8 hours of sleep each night.  This information is not intended to replace advice given to you by your health care provider. Make sure you discuss any questions you have with your health care provider.  Document Released: 06/09/2007 Document Revised: 08/18/2017 Document Reviewed: 08/18/2017  [employer]vier Interactive Patient Education  2019 [employer]

## 2018-01-11 NOTE — Care Management Important Message (Signed)
Important Message  Patient Details  Name: Dylan Williamson MRN: [id number] Date of Birth: 1934/02/21   Medicare Important Message Given:  Yes    Sherald Barge, RN 01/11/2018, 9:55 AM

## 2018-01-12 ENCOUNTER — Other Ambulatory Visit: Payer: Self-pay | Admitting: Cardiology

## 2018-01-12 DIAGNOSIS — L89621 Pressure ulcer of left heel, stage 1: Secondary | ICD-10-CM | POA: Diagnosis not present

## 2018-01-12 DIAGNOSIS — N184 Chronic kidney disease, stage 4 (severe): Secondary | ICD-10-CM | POA: Diagnosis not present

## 2018-01-12 DIAGNOSIS — I480 Paroxysmal atrial fibrillation: Secondary | ICD-10-CM | POA: Diagnosis not present

## 2018-01-12 DIAGNOSIS — E1122 Type 2 diabetes mellitus with diabetic chronic kidney disease: Secondary | ICD-10-CM | POA: Diagnosis not present

## 2018-01-12 DIAGNOSIS — J13 Pneumonia due to Streptococcus pneumoniae: Secondary | ICD-10-CM | POA: Diagnosis not present

## 2018-01-12 DIAGNOSIS — I5032 Chronic diastolic (congestive) heart failure: Secondary | ICD-10-CM | POA: Diagnosis not present

## 2018-01-13 LAB — CULTURE, RESPIRATORY W GRAM STAIN

## 2018-01-13 LAB — CULTURE, RESPIRATORY

## 2018-01-14 DIAGNOSIS — I5032 Chronic diastolic (congestive) heart failure: Secondary | ICD-10-CM | POA: Diagnosis not present

## 2018-01-14 DIAGNOSIS — I480 Paroxysmal atrial fibrillation: Secondary | ICD-10-CM | POA: Diagnosis not present

## 2018-01-14 DIAGNOSIS — J13 Pneumonia due to Streptococcus pneumoniae: Secondary | ICD-10-CM | POA: Diagnosis not present

## 2018-01-14 DIAGNOSIS — L89621 Pressure ulcer of left heel, stage 1: Secondary | ICD-10-CM | POA: Diagnosis not present

## 2018-01-14 DIAGNOSIS — N184 Chronic kidney disease, stage 4 (severe): Secondary | ICD-10-CM | POA: Diagnosis not present

## 2018-01-14 DIAGNOSIS — E1122 Type 2 diabetes mellitus with diabetic chronic kidney disease: Secondary | ICD-10-CM | POA: Diagnosis not present

## 2018-01-14 LAB — CULTURE, BLOOD (ROUTINE X 2)
Culture: NO GROWTH
Culture: NO GROWTH
Special Requests: ADEQUATE
Special Requests: ADEQUATE

## 2018-01-16 DIAGNOSIS — N184 Chronic kidney disease, stage 4 (severe): Secondary | ICD-10-CM | POA: Diagnosis not present

## 2018-01-16 DIAGNOSIS — I5032 Chronic diastolic (congestive) heart failure: Secondary | ICD-10-CM | POA: Diagnosis not present

## 2018-01-16 DIAGNOSIS — J13 Pneumonia due to Streptococcus pneumoniae: Secondary | ICD-10-CM | POA: Diagnosis not present

## 2018-01-16 DIAGNOSIS — I480 Paroxysmal atrial fibrillation: Secondary | ICD-10-CM | POA: Diagnosis not present

## 2018-01-16 DIAGNOSIS — L89621 Pressure ulcer of left heel, stage 1: Secondary | ICD-10-CM | POA: Diagnosis not present

## 2018-01-16 DIAGNOSIS — E1122 Type 2 diabetes mellitus with diabetic chronic kidney disease: Secondary | ICD-10-CM | POA: Diagnosis not present

## 2018-01-17 DIAGNOSIS — E1122 Type 2 diabetes mellitus with diabetic chronic kidney disease: Secondary | ICD-10-CM | POA: Diagnosis not present

## 2018-01-17 DIAGNOSIS — J13 Pneumonia due to Streptococcus pneumoniae: Secondary | ICD-10-CM | POA: Diagnosis not present

## 2018-01-17 DIAGNOSIS — N184 Chronic kidney disease, stage 4 (severe): Secondary | ICD-10-CM | POA: Diagnosis not present

## 2018-01-17 DIAGNOSIS — L89621 Pressure ulcer of left heel, stage 1: Secondary | ICD-10-CM | POA: Diagnosis not present

## 2018-01-17 DIAGNOSIS — I5032 Chronic diastolic (congestive) heart failure: Secondary | ICD-10-CM | POA: Diagnosis not present

## 2018-01-17 DIAGNOSIS — I480 Paroxysmal atrial fibrillation: Secondary | ICD-10-CM | POA: Diagnosis not present

## 2018-01-18 ENCOUNTER — Telehealth: Payer: Self-pay | Admitting: Cardiology

## 2018-01-18 ENCOUNTER — Other Ambulatory Visit: Payer: Self-pay | Admitting: *Deleted

## 2018-01-18 DIAGNOSIS — N184 Chronic kidney disease, stage 4 (severe): Secondary | ICD-10-CM | POA: Diagnosis not present

## 2018-01-18 DIAGNOSIS — E1122 Type 2 diabetes mellitus with diabetic chronic kidney disease: Secondary | ICD-10-CM | POA: Diagnosis not present

## 2018-01-18 DIAGNOSIS — I5032 Chronic diastolic (congestive) heart failure: Secondary | ICD-10-CM | POA: Diagnosis not present

## 2018-01-18 DIAGNOSIS — L89621 Pressure ulcer of left heel, stage 1: Secondary | ICD-10-CM | POA: Diagnosis not present

## 2018-01-18 DIAGNOSIS — I480 Paroxysmal atrial fibrillation: Secondary | ICD-10-CM | POA: Diagnosis not present

## 2018-01-18 DIAGNOSIS — J13 Pneumonia due to Streptococcus pneumoniae: Secondary | ICD-10-CM | POA: Diagnosis not present

## 2018-01-18 MED ORDER — AMIODARONE HCL 200 MG PO TABS: 100.0000 mg | ORAL_TABLET | Freq: Every day | ORAL | 3 refills | Status: AC

## 2018-01-18 NOTE — Telephone Encounter (Signed)
Returned call to [employer] w/ [facility] - still at home with patient.  Confirmed that patient should still be taking Amiodarone 200mg  - 1/2 tab daily.  Refill sent to [place] to be delivered at [facility] request.  Informed her that patient was due to be seen this January (3 mo from October visit).  Juliann Pulse stated she will let his daughter know & will have her call to get that scheduled.

## 2018-01-18 NOTE — Telephone Encounter (Signed)
Correction on last OV - his last OV was October 2018.  Zigmund Daniel w/ [facility] informed.  She stated that she has told the daughter & she will call us to schedule.

## 2018-01-18 NOTE — Telephone Encounter (Signed)
amiodarone (PACERONE) 200 MG tablet   Has question about dose and if he is still suppose to be taking medication

## 2018-01-19 DIAGNOSIS — I5032 Chronic diastolic (congestive) heart failure: Secondary | ICD-10-CM | POA: Diagnosis not present

## 2018-01-19 DIAGNOSIS — E1122 Type 2 diabetes mellitus with diabetic chronic kidney disease: Secondary | ICD-10-CM | POA: Diagnosis not present

## 2018-01-19 DIAGNOSIS — J13 Pneumonia due to Streptococcus pneumoniae: Secondary | ICD-10-CM | POA: Diagnosis not present

## 2018-01-19 DIAGNOSIS — N184 Chronic kidney disease, stage 4 (severe): Secondary | ICD-10-CM | POA: Diagnosis not present

## 2018-01-19 DIAGNOSIS — I480 Paroxysmal atrial fibrillation: Secondary | ICD-10-CM | POA: Diagnosis not present

## 2018-01-19 DIAGNOSIS — L89621 Pressure ulcer of left heel, stage 1: Secondary | ICD-10-CM | POA: Diagnosis not present

## 2018-01-20 DIAGNOSIS — L89621 Pressure ulcer of left heel, stage 1: Secondary | ICD-10-CM | POA: Diagnosis not present

## 2018-01-20 DIAGNOSIS — J13 Pneumonia due to Streptococcus pneumoniae: Secondary | ICD-10-CM | POA: Diagnosis not present

## 2018-01-20 DIAGNOSIS — E1122 Type 2 diabetes mellitus with diabetic chronic kidney disease: Secondary | ICD-10-CM | POA: Diagnosis not present

## 2018-01-20 DIAGNOSIS — I5032 Chronic diastolic (congestive) heart failure: Secondary | ICD-10-CM | POA: Diagnosis not present

## 2018-01-20 DIAGNOSIS — I480 Paroxysmal atrial fibrillation: Secondary | ICD-10-CM | POA: Diagnosis not present

## 2018-01-20 DIAGNOSIS — N184 Chronic kidney disease, stage 4 (severe): Secondary | ICD-10-CM | POA: Diagnosis not present

## 2018-01-25 DIAGNOSIS — L89621 Pressure ulcer of left heel, stage 1: Secondary | ICD-10-CM | POA: Diagnosis not present

## 2018-01-25 DIAGNOSIS — E1122 Type 2 diabetes mellitus with diabetic chronic kidney disease: Secondary | ICD-10-CM | POA: Diagnosis not present

## 2018-01-25 DIAGNOSIS — I480 Paroxysmal atrial fibrillation: Secondary | ICD-10-CM | POA: Diagnosis not present

## 2018-01-25 DIAGNOSIS — N184 Chronic kidney disease, stage 4 (severe): Secondary | ICD-10-CM | POA: Diagnosis not present

## 2018-01-25 DIAGNOSIS — I5032 Chronic diastolic (congestive) heart failure: Secondary | ICD-10-CM | POA: Diagnosis not present

## 2018-01-25 DIAGNOSIS — J13 Pneumonia due to Streptococcus pneumoniae: Secondary | ICD-10-CM | POA: Diagnosis not present

## 2018-01-25 DIAGNOSIS — D649 Anemia, unspecified: Secondary | ICD-10-CM | POA: Diagnosis not present

## 2018-01-27 DIAGNOSIS — F419 Anxiety disorder, unspecified: Secondary | ICD-10-CM | POA: Diagnosis not present

## 2018-01-27 DIAGNOSIS — I959 Hypotension, unspecified: Secondary | ICD-10-CM | POA: Diagnosis not present

## 2018-01-27 DIAGNOSIS — I130 Hypertensive heart and chronic kidney disease with heart failure and stage 1 through stage 4 chronic kidney disease, or unspecified chronic kidney disease: Secondary | ICD-10-CM | POA: Diagnosis present

## 2018-01-27 DIAGNOSIS — J156 Pneumonia due to other aerobic Gram-negative bacteria: Secondary | ICD-10-CM | POA: Diagnosis not present

## 2018-01-27 DIAGNOSIS — Z7984 Long term (current) use of oral hypoglycemic drugs: Secondary | ICD-10-CM | POA: Diagnosis not present

## 2018-01-27 DIAGNOSIS — S299XXA Unspecified injury of thorax, initial encounter: Secondary | ICD-10-CM | POA: Diagnosis not present

## 2018-01-27 DIAGNOSIS — N390 Urinary tract infection, site not specified: Secondary | ICD-10-CM | POA: Diagnosis present

## 2018-01-27 DIAGNOSIS — E1122 Type 2 diabetes mellitus with diabetic chronic kidney disease: Secondary | ICD-10-CM | POA: Diagnosis present

## 2018-01-27 DIAGNOSIS — I509 Heart failure, unspecified: Secondary | ICD-10-CM | POA: Diagnosis present

## 2018-01-27 DIAGNOSIS — R682 Dry mouth, unspecified: Secondary | ICD-10-CM | POA: Diagnosis not present

## 2018-01-27 DIAGNOSIS — E1140 Type 2 diabetes mellitus with diabetic neuropathy, unspecified: Secondary | ICD-10-CM | POA: Diagnosis present

## 2018-01-27 DIAGNOSIS — Z515 Encounter for palliative care: Secondary | ICD-10-CM | POA: Diagnosis not present

## 2018-01-27 DIAGNOSIS — R0902 Hypoxemia: Secondary | ICD-10-CM | POA: Diagnosis not present

## 2018-01-27 DIAGNOSIS — I10 Essential (primary) hypertension: Secondary | ICD-10-CM | POA: Diagnosis not present

## 2018-01-27 DIAGNOSIS — R52 Pain, unspecified: Secondary | ICD-10-CM | POA: Diagnosis not present

## 2018-01-27 DIAGNOSIS — E119 Type 2 diabetes mellitus without complications: Secondary | ICD-10-CM | POA: Diagnosis not present

## 2018-01-27 DIAGNOSIS — N183 Chronic kidney disease, stage 3 (moderate): Secondary | ICD-10-CM | POA: Diagnosis present

## 2018-01-27 DIAGNOSIS — I951 Orthostatic hypotension: Secondary | ICD-10-CM | POA: Diagnosis present

## 2018-01-27 DIAGNOSIS — E1129 Type 2 diabetes mellitus with other diabetic kidney complication: Secondary | ICD-10-CM | POA: Diagnosis not present

## 2018-01-27 DIAGNOSIS — N179 Acute kidney failure, unspecified: Secondary | ICD-10-CM | POA: Diagnosis present

## 2018-01-27 DIAGNOSIS — Z7982 Long term (current) use of aspirin: Secondary | ICD-10-CM | POA: Diagnosis not present

## 2018-01-27 DIAGNOSIS — J440 Chronic obstructive pulmonary disease with acute lower respiratory infection: Secondary | ICD-10-CM | POA: Diagnosis not present

## 2018-01-27 DIAGNOSIS — J90 Pleural effusion, not elsewhere classified: Secondary | ICD-10-CM | POA: Diagnosis not present

## 2018-01-27 DIAGNOSIS — Z9842 Cataract extraction status, left eye: Secondary | ICD-10-CM | POA: Diagnosis not present

## 2018-01-27 DIAGNOSIS — F0280 Dementia in other diseases classified elsewhere without behavioral disturbance: Secondary | ICD-10-CM | POA: Diagnosis present

## 2018-01-27 DIAGNOSIS — W06XXXA Fall from bed, initial encounter: Secondary | ICD-10-CM | POA: Diagnosis not present

## 2018-01-27 DIAGNOSIS — J159 Unspecified bacterial pneumonia: Secondary | ICD-10-CM | POA: Diagnosis not present

## 2018-01-27 DIAGNOSIS — R531 Weakness: Secondary | ICD-10-CM | POA: Diagnosis present

## 2018-01-27 DIAGNOSIS — Z66 Do not resuscitate: Secondary | ICD-10-CM | POA: Diagnosis not present

## 2018-01-27 DIAGNOSIS — R5381 Other malaise: Secondary | ICD-10-CM | POA: Diagnosis present

## 2018-01-27 DIAGNOSIS — H04129 Dry eye syndrome of unspecified lacrimal gland: Secondary | ICD-10-CM | POA: Diagnosis not present

## 2018-01-27 DIAGNOSIS — E46 Unspecified protein-calorie malnutrition: Secondary | ICD-10-CM | POA: Diagnosis not present

## 2018-01-27 DIAGNOSIS — F17200 Nicotine dependence, unspecified, uncomplicated: Secondary | ICD-10-CM | POA: Diagnosis present

## 2018-01-27 DIAGNOSIS — J9611 Chronic respiratory failure with hypoxia: Secondary | ICD-10-CM | POA: Diagnosis not present

## 2018-01-27 DIAGNOSIS — G309 Alzheimer's disease, unspecified: Secondary | ICD-10-CM | POA: Diagnosis present

## 2018-01-27 DIAGNOSIS — A419 Sepsis, unspecified organism: Secondary | ICD-10-CM | POA: Diagnosis not present

## 2018-01-27 DIAGNOSIS — Z9841 Cataract extraction status, right eye: Secondary | ICD-10-CM | POA: Diagnosis not present

## 2018-01-27 DIAGNOSIS — J189 Pneumonia, unspecified organism: Secondary | ICD-10-CM | POA: Diagnosis not present

## 2018-01-27 DIAGNOSIS — I4891 Unspecified atrial fibrillation: Secondary | ICD-10-CM | POA: Diagnosis present

## 2018-01-27 DIAGNOSIS — E039 Hypothyroidism, unspecified: Secondary | ICD-10-CM | POA: Diagnosis present

## 2018-02-02 DIAGNOSIS — Z7401 Bed confinement status: Secondary | ICD-10-CM | POA: Diagnosis not present

## 2018-02-02 DIAGNOSIS — R52 Pain, unspecified: Secondary | ICD-10-CM | POA: Diagnosis not present

## 2018-02-02 DIAGNOSIS — R404 Transient alteration of awareness: Secondary | ICD-10-CM | POA: Diagnosis not present

## 2018-03-05 DEATH — deceased

## 2018-05-02 ENCOUNTER — Other Ambulatory Visit: Payer: Self-pay | Admitting: *Deleted

## 2018-05-02 NOTE — Patient Outreach (Signed)
Chart entry per [facility] to close program.  Jacqlyn Larsen [facility], BSN [place] Coordinator [phone]

## 2019-05-29 IMAGING — DX DG CHEST 2V
2 series · 2 of 2 positions shown · non-contrast
Comparison: 06/23/2015

CLINICAL DATA: Fatigue loss of appetite

EXAM:
CHEST - 2 VIEW

[chest lat]
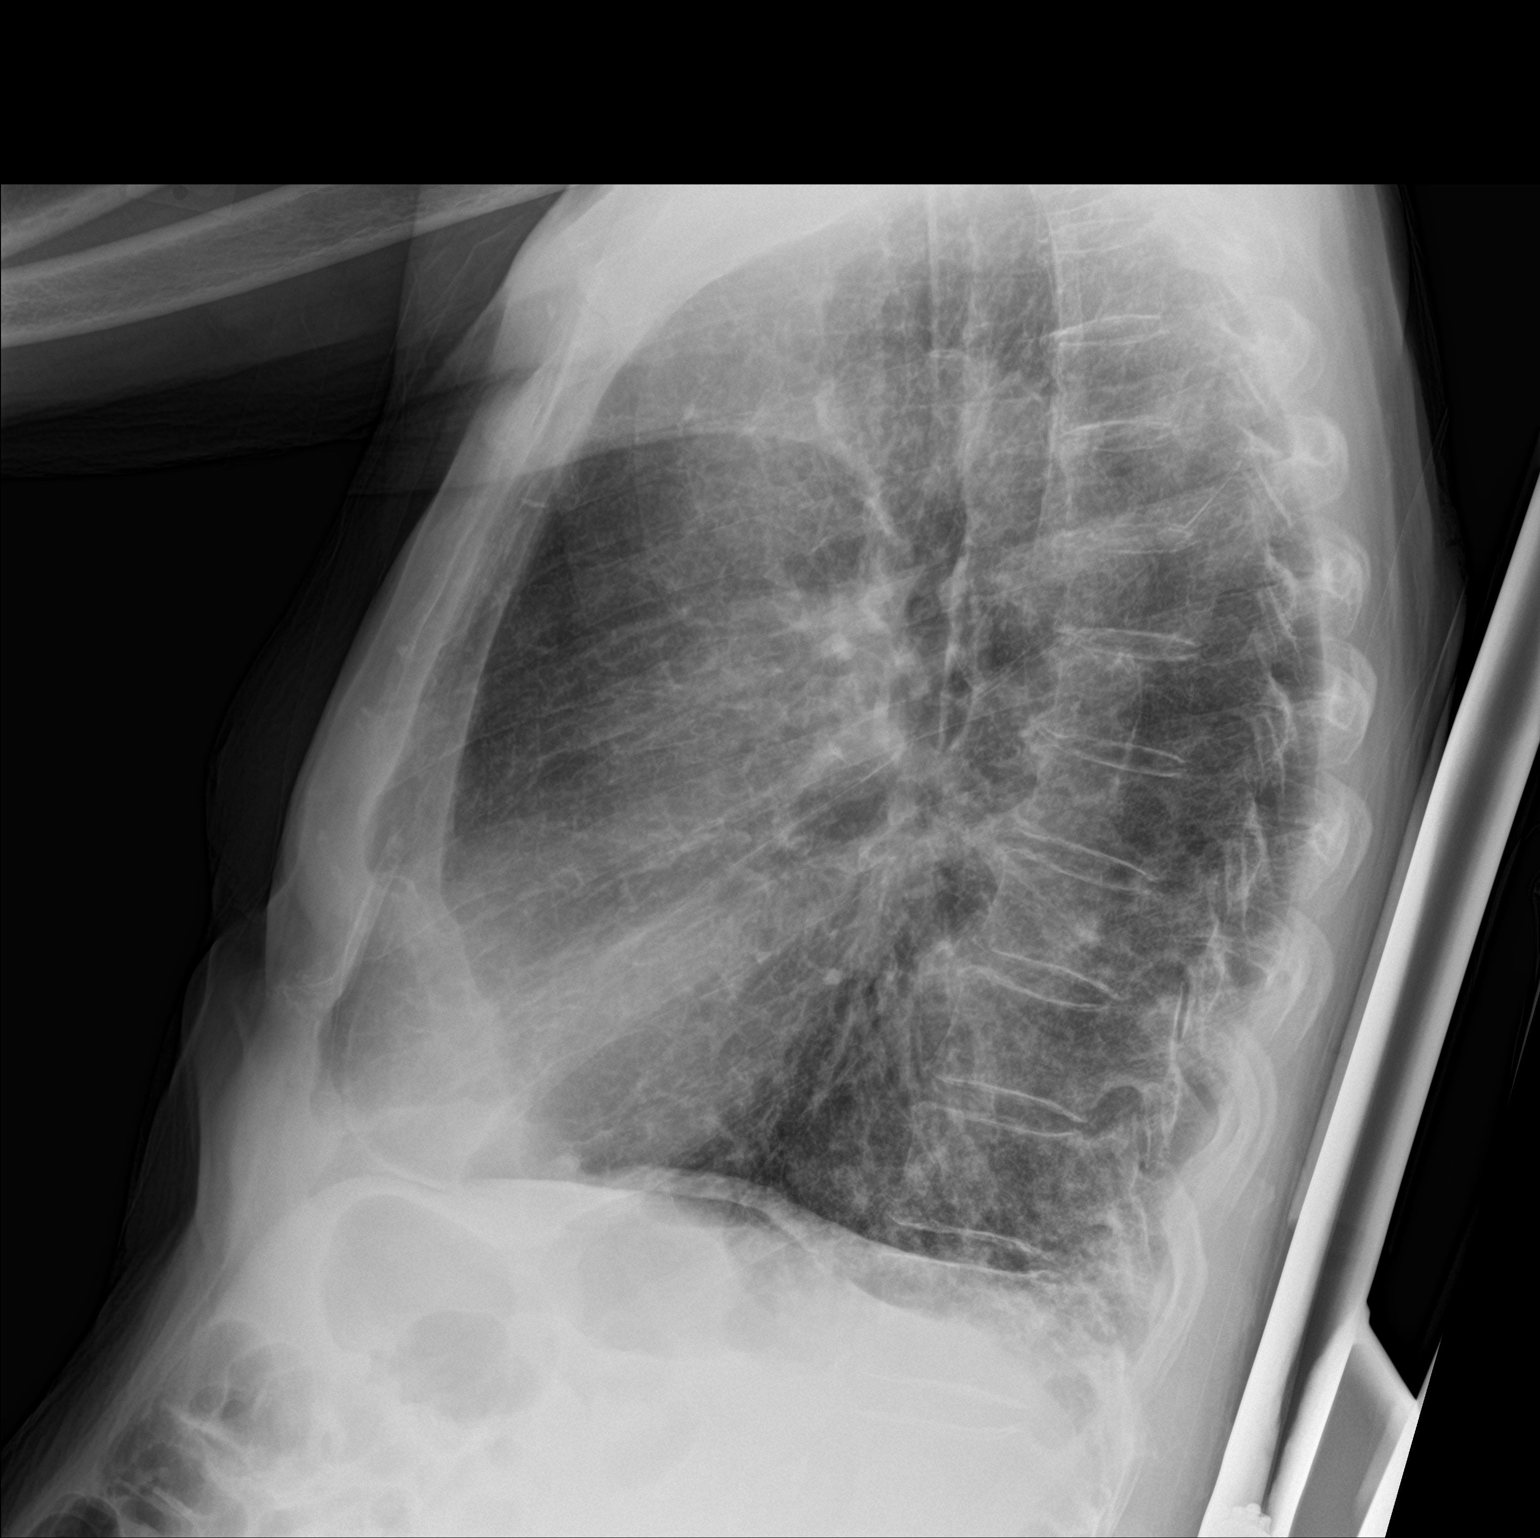

[chest ap]
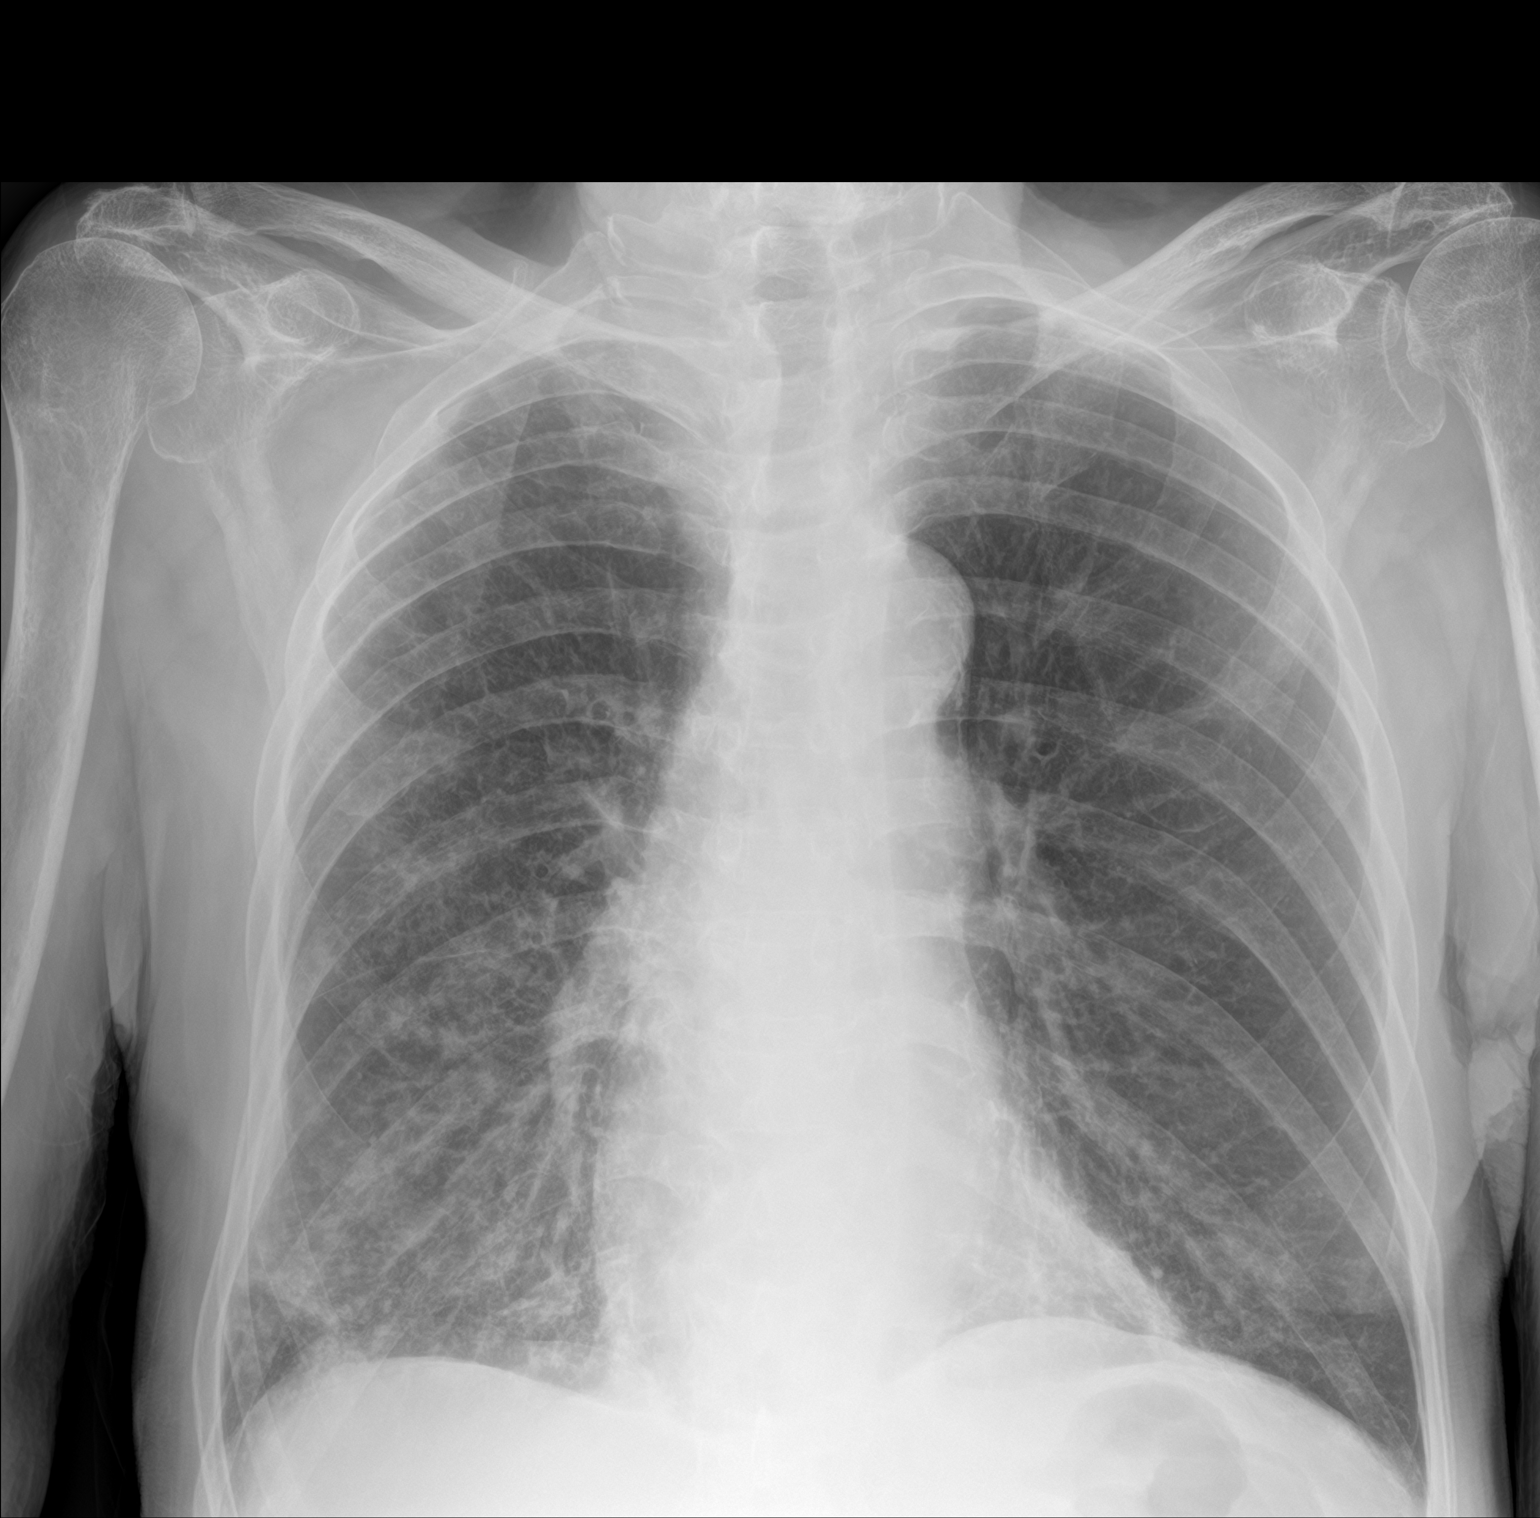

[2 of 2 positions shown; findings below may reference images not displayed]

FINDINGS: Coarse reticulonodular opacity in the right greater than left lower
lung zones. No significant pleural effusion. Normal heart size. No
pneumothorax.
IMPRESSION: Mild reticulonodular opacity in the right greater than left lower
lungs, suspicious pneumonia, possible atypical pneumonia. Negative
for consolidation or pleural effusion.
